# Patient Record
Sex: Female | Born: 2000 | Race: Black or African American | Hispanic: No | Marital: Single | Smoking: Never smoker
Health system: Southern US, Community
[De-identification: ages and names within clinical notes are randomized; demographics above are authoritative.]

## PROBLEM LIST (undated history)

## (undated) ENCOUNTER — Inpatient Hospital Stay (HOSPITAL_COMMUNITY): Payer: Self-pay

## (undated) ENCOUNTER — Inpatient Hospital Stay (HOSPITAL_COMMUNITY): Payer: Medicaid Other

## (undated) DIAGNOSIS — D649 Anemia, unspecified: Secondary | ICD-10-CM

## (undated) DIAGNOSIS — Z8619 Personal history of other infectious and parasitic diseases: Secondary | ICD-10-CM

## (undated) HISTORY — PX: NO PAST SURGERIES: SHX2092

## (undated) HISTORY — PX: INDUCED ABORTION: SHX677

## (undated) HISTORY — DX: Personal history of other infectious and parasitic diseases: Z86.19

---
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
## 2018-01-02 LAB — OB RESULTS CONSOLE RPR: RPR: NONREACTIVE

## 2018-01-02 LAB — OB RESULTS CONSOLE HEPATITIS B SURFACE ANTIGEN: HEP B S AG: NEGATIVE

## 2018-01-02 LAB — OB RESULTS CONSOLE HIV ANTIBODY (ROUTINE TESTING): HIV: NONREACTIVE

## 2018-01-02 LAB — OB RESULTS CONSOLE RUBELLA ANTIBODY, IGM: Rubella: IMMUNE

## 2018-01-16 ENCOUNTER — Inpatient Hospital Stay (HOSPITAL_COMMUNITY)
Admission: AD | Admit: 2018-01-16 | Discharge: 2018-01-16 | Disposition: A | Discharge status: Home / Self Care | Payer: 59 | Source: Ambulatory Visit | Attending: Obstetrics and Gynecology | Admitting: Obstetrics and Gynecology

## 2018-01-16 ENCOUNTER — Encounter (HOSPITAL_COMMUNITY): Payer: Self-pay | Admitting: *Deleted

## 2018-01-16 DIAGNOSIS — R109 Unspecified abdominal pain: Secondary | ICD-10-CM | POA: Insufficient documentation

## 2018-01-16 DIAGNOSIS — O26892 Other specified pregnancy related conditions, second trimester: Secondary | ICD-10-CM | POA: Diagnosis not present

## 2018-01-16 DIAGNOSIS — Z3A14 14 weeks gestation of pregnancy: Secondary | ICD-10-CM | POA: Insufficient documentation

## 2018-01-16 DIAGNOSIS — N898 Other specified noninflammatory disorders of vagina: Secondary | ICD-10-CM | POA: Diagnosis not present

## 2018-01-16 LAB — WET PREP, GENITAL
Clue Cells Wet Prep HPF POC: NONE SEEN
SPERM: NONE SEEN
Trich, Wet Prep: NONE SEEN
WBC WET PREP: NONE SEEN

## 2018-01-16 LAB — URINALYSIS, ROUTINE W REFLEX MICROSCOPIC
BILIRUBIN URINE: NEGATIVE
GLUCOSE, UA: NEGATIVE mg/dL
HGB URINE DIPSTICK: NEGATIVE
Ketones, ur: NEGATIVE mg/dL
Leukocytes, UA: NEGATIVE
Nitrite: NEGATIVE
PH: 6 (ref 5.0–8.0)
Protein, ur: NEGATIVE mg/dL
SPECIFIC GRAVITY, URINE: 1.009 (ref 1.005–1.030)

## 2018-01-16 MED ORDER — TERCONAZOLE 0.4 % VA CREA
1.0000 | TOPICAL_CREAM | Freq: Every day | VAGINAL | 0 refills | Status: DC
Start: 2018-01-16 — End: 2018-03-28

## 2018-01-16 NOTE — MAU Provider Note (Signed)
History     CSN: [id number]  Arrival date and time: 01/16/18 1642   First Provider Initiated Contact with Patient 01/16/18 1842      Chief Complaint  Patient presents with  . Vaginal Bleeding  . Abdominal Pain   HPI   Ms.Christy Moran is a 17 y.o. female G1P0 @ [redacted]w[redacted]d here in MAU with vaginal bleeding. Says the bleeding started today. She was using the bathroom and noticed bleeding when she wiped. The bleeding is bright red in color. Last intercourse was 2 days ago. She noticed the bleeding 1 x today and did not see it again. + lower abdominal cramping. Currently rates her pain 4/10. Did not take anything for the pain.   OB History    Gravida  1   Para      Term      Preterm      AB      Living        SAB      TAB      Ectopic      Multiple      Live Births              History reviewed. No pertinent past medical history.  History reviewed. No pertinent surgical history.  History reviewed. No pertinent family history.  Social History   Tobacco Use  . Smoking status: Never Smoker  . Smokeless tobacco: Never Used  Substance Use Topics  . Alcohol use: No  . Drug use: Never    Allergies: No Known Allergies  Medications Prior to Admission  Medication Sig Dispense Refill Last Dose  . ibuprofen (ADVIL,MOTRIN) 600 MG tablet Take 1 tablet (600 mg total) by mouth every 8 (eight) hours as needed. 30 tablet 0    Results for orders placed or performed during the hospital encounter of 01/16/18 (from the past 48 hour(s))  Urinalysis, Routine w reflex microscopic     Status: Abnormal   Collection Time: 01/16/18  6:32 PM  Result Value Ref Range   Color, Urine STRAW (A) YELLOW   APPearance CLEAR CLEAR   Specific Gravity, Urine 1.009 1.005 - 1.030   pH 6.0 5.0 - 8.0   Glucose, UA NEGATIVE NEGATIVE mg/dL   Hgb urine dipstick NEGATIVE NEGATIVE   Bilirubin Urine NEGATIVE NEGATIVE   Ketones, ur NEGATIVE NEGATIVE mg/dL   Protein, ur NEGATIVE NEGATIVE mg/dL    Nitrite NEGATIVE NEGATIVE   Leukocytes, UA NEGATIVE NEGATIVE    Comment: Performed at [facility], [address], [place], [place] [address]  Wet prep, genital     Status: Abnormal   Collection Time: 01/16/18  6:52 PM  Result Value Ref Range   Yeast Wet Prep HPF POC PRESENT (A) NONE SEEN   Trich, Wet Prep NONE SEEN NONE SEEN   Clue Cells Wet Prep HPF POC NONE SEEN NONE SEEN   WBC, Wet Prep HPF POC NONE SEEN NONE SEEN   Sperm NONE SEEN     Comment: Performed at [facility], [address], [place], [place] [address]   Review of Systems  Constitutional: Negative for fever.  Gastrointestinal: Positive for abdominal pain (Mild cramping 2/10).  Genitourinary: Positive for vaginal bleeding and vaginal discharge.   Physical Exam   Blood pressure 104/64, pulse 99, temperature 98.1 F (36.7 C), temperature source Oral, resp. rate 18, height 5\' 4"  (1.626 m), weight 70 kg, last menstrual period 10/06/2017.  Physical Exam  Constitutional: She is oriented to person, place, and time. She  appears well-developed and well-nourished. No distress.  HENT:  Head: Normocephalic.  Eyes: Pupils are equal, round, and reactive to light.  Genitourinary:  Genitourinary Comments: Vagina - Small amount of thick, white vaginal discharge, no odor  Cervix - No contact bleeding, no active bleeding  Bimanual exam: Cervix closed GC/Chlam, wet prep done Chaperone present for exam.   Neurological: She is alert and oriented to person, place, and time.  Skin: Skin is warm. She is not diaphoretic.  Psychiatric: Her behavior is normal.   MAU Course  Procedures  None  MDM  + fetal heart tones  Wet prep and GC Discussed HPI, labs and results with Dr. Tenny Crawoss ok for DC home O positive blood type   Assessment and Plan   A:  1. Vaginal discharge during pregnancy in second trimester   2. [redacted] weeks gestation of pregnancy     P:  Discharge home in stable condition Return to MAU if symptoms  worsen Rx: Terazol cream  R/U with Dr. Tenny Crawoss, call the office in the future for non emergent complaints.   Venia Carbonasch, Jennifer I, NP 01/16/2018 7:59 PM

## 2018-01-16 NOTE — MAU Note (Signed)
Patient had some red bleeding when she wiped using the bathroom today.  Also having some intermittent abdominal cramping she rates a 2/10.  No bleeding at this time.  Last intercourse 01/14/17.

## 2018-01-16 NOTE — Discharge Instructions (Signed)

## 2018-01-17 LAB — GC/CHLAMYDIA PROBE AMP (~~LOC~~) NOT AT ARMC
Chlamydia: NEGATIVE
Neisseria Gonorrhea: NEGATIVE

## 2018-02-14 ENCOUNTER — Encounter: Payer: Self-pay | Admitting: Family Medicine

## 2018-02-14 ENCOUNTER — Ambulatory Visit (INDEPENDENT_AMBULATORY_CARE_PROVIDER_SITE_OTHER): Payer: 59 | Admitting: Family Medicine

## 2018-02-14 DIAGNOSIS — Z3401 Encounter for supervision of normal first pregnancy, first trimester: Secondary | ICD-10-CM | POA: Diagnosis not present

## 2018-02-14 DIAGNOSIS — Z3402 Encounter for supervision of normal first pregnancy, second trimester: Secondary | ICD-10-CM

## 2018-02-14 NOTE — Progress Notes (Signed)
   PRENATAL VISIT NOTE  Subjective:  Christy Moran is a 17 y.o. G1P0 at [redacted]w[redacted]d being seen today for initial prenatal care. She was being seen at [facility], but had to transfer due to insurance reasons. She is here with FOB, [place].  She is currently monitored for the following issues for this low-risk pregnancy and has Encounter for supervision of normal first pregnancy in first trimester on their problem list.  Patient reports no complaints.  Contractions: Not present. Vag. Bleeding: None.  Movement: Present. Denies leaking of fluid.   The following portions of the patient's history were reviewed and updated as appropriate: allergies, current medications, past family history, past medical history, past social history, past surgical history and problem list. Problem list updated.  Objective:   Vitals:   02/14/18 1047  BP: 132/74  Pulse: 69  Weight: 161 lb (73 kg)    Fetal Status: Fetal Heart Rate (bpm): 145 Fundal Height: 19 cm Movement: Present     General:  Alert, oriented and cooperative. Patient is in no acute distress.  Skin: Skin is warm and dry. No rash noted.   Cardiovascular: Normal heart rate noted  Respiratory: Normal respiratory effort, no problems with respiration noted  Abdomen: Soft, gravid, appropriate for gestational age.  Pain/Pressure: Absent     Pelvic: Cervical exam deferred        Extremities: Normal range of motion.  Edema: None  Mental Status: Normal mood and affect. Normal behavior. Normal judgment and thought content.   Assessment and Plan:  Pregnancy: G1P0 at [redacted]w[redacted]d  1. Encounter for supervision of normal first pregnancy in first trimester FHT and FH normal. Discussed nature of practice, midwifes, fellows, and residents/students. PNL and T21 screening done at [facility] - will obtain records. - [place] MFM OB COMP + 14 WK; Future  Preterm labor symptoms and general obstetric precautions including but not limited to vaginal bleeding, contractions,  leaking of fluid and fetal movement were reviewed in detail with the patient. Please refer to After Visit Summary for other counseling recommendations.  Return in about 4 weeks (around 03/14/2018) for OB f/u.  No future appointments.  Levie Heritage, DO

## 2018-02-17 ENCOUNTER — Encounter (HOSPITAL_COMMUNITY): Payer: Self-pay

## 2018-02-26 ENCOUNTER — Ambulatory Visit (HOSPITAL_COMMUNITY)
Admission: RE | Admit: 2018-02-26 | Discharge: 2018-02-26 | Disposition: A | Discharge status: Home / Self Care | Payer: 59 | Source: Ambulatory Visit | Attending: Family Medicine | Admitting: Family Medicine

## 2018-02-26 DIAGNOSIS — Z363 Encounter for antenatal screening for malformations: Secondary | ICD-10-CM

## 2018-02-26 DIAGNOSIS — Z3A20 20 weeks gestation of pregnancy: Secondary | ICD-10-CM | POA: Diagnosis not present

## 2018-02-26 DIAGNOSIS — Z3402 Encounter for supervision of normal first pregnancy, second trimester: Secondary | ICD-10-CM | POA: Diagnosis not present

## 2018-02-26 DIAGNOSIS — Z3401 Encounter for supervision of normal first pregnancy, first trimester: Secondary | ICD-10-CM

## 2018-03-13 ENCOUNTER — Ambulatory Visit (INDEPENDENT_AMBULATORY_CARE_PROVIDER_SITE_OTHER): Payer: Medicaid Other | Admitting: Family Medicine

## 2018-03-13 VITALS — BP 114/65 | HR 72 | Wt 166.0 lb

## 2018-03-13 DIAGNOSIS — Z3402 Encounter for supervision of normal first pregnancy, second trimester: Secondary | ICD-10-CM

## 2018-03-13 DIAGNOSIS — O26892 Other specified pregnancy related conditions, second trimester: Secondary | ICD-10-CM | POA: Diagnosis not present

## 2018-03-13 DIAGNOSIS — Z23 Encounter for immunization: Secondary | ICD-10-CM | POA: Diagnosis not present

## 2018-03-13 DIAGNOSIS — M9902 Segmental and somatic dysfunction of thoracic region: Secondary | ICD-10-CM

## 2018-03-13 DIAGNOSIS — G44209 Tension-type headache, unspecified, not intractable: Secondary | ICD-10-CM

## 2018-03-13 DIAGNOSIS — Z3401 Encounter for supervision of normal first pregnancy, first trimester: Secondary | ICD-10-CM

## 2018-03-13 NOTE — Progress Notes (Signed)
   PRENATAL VISIT NOTE  Subjective:  Christy Moran is a 17 y.o. G1P0 at [redacted]w[redacted]d being seen today for ongoing prenatal care.  She is currently monitored for the following issues for this low-risk pregnancy and has Encounter for supervision of normal first pregnancy in first trimester on their problem list.  Patient reports tension headache - starts in shoulders and neck, then to back of head. Almost daily. Hasn't tried anything.  Contractions: Not present. Vag. Bleeding: None.  Movement: Present. Denies leaking of fluid.   The following portions of the patient's history were reviewed and updated as appropriate: allergies, current medications, past family history, past medical history, past social history, past surgical history and problem list. Problem list updated.  Objective:   Vitals:   03/13/18 1343  BP: 114/65  Pulse: 72  Weight: 166 lb (75.3 kg)    Fetal Status:   Fundal Height: 22 cm Movement: Present     General:  Alert, oriented and cooperative. Patient is in no acute distress.  Skin: Skin is warm and dry. No rash noted.   Cardiovascular: Normal heart rate noted  Respiratory: Normal respiratory effort, no problems with respiration noted  Abdomen: Soft, gravid, appropriate for gestational age. Pain/Pressure: Absent     Pelvic:  Cervical exam deferred        MSK: Restriction, tenderness, tissue texture changes, and paraspinal spasm in the cervical  spine  Neuro: Moves all four extremities with no focal neurological deficit  Extremities: Normal range of motion.  Edema: None  Mental Status: Normal mood and affect. Normal behavior. Normal judgment and thought content.   OSE: Head FSRL  Cervical C3 FSRR  Thoracic T4 FSRL, T6 FSRR  Rib 4 inhaled on left  Lumbar   Sacrum   Pelvis     Assessment and Plan:  Pregnancy: G1P0 at [redacted]w[redacted]d  1. Encounter for supervision of normal first pregnancy in first trimester FHT normal  2. Tension headache 3. Somatic Dysfunction Thoracic  Spine OMT done after patient permission. HVLA technique utilized. 4 areas treated with improvement of tissue texture and joint mobility. Patient tolerated procedure well.    Preterm labor symptoms and general obstetric precautions including but not limited to vaginal bleeding, contractions, leaking of fluid and fetal movement were reviewed in detail with the patient. Please refer to After Visit Summary for other counseling recommendations.  Return in about 4 weeks (around 04/10/2018) for OB f/u, 2 hr GTT.  Levie Heritage, DO

## 2018-03-28 ENCOUNTER — Ambulatory Visit (HOSPITAL_COMMUNITY)
Admission: EM | Admit: 2018-03-28 | Discharge: 2018-03-28 | Disposition: A | Discharge status: Home / Self Care | Payer: Medicaid Other | Attending: Family Medicine | Admitting: Family Medicine

## 2018-03-28 ENCOUNTER — Encounter (HOSPITAL_COMMUNITY): Payer: Self-pay

## 2018-03-28 DIAGNOSIS — N751 Abscess of Bartholin's gland: Secondary | ICD-10-CM

## 2018-03-28 MED ORDER — CEPHALEXIN 500 MG PO CAPS: 500.0000 mg | ORAL_CAPSULE | Freq: Three times a day (TID) | ORAL | 0 refills | Status: DC

## 2018-03-28 NOTE — ED Triage Notes (Signed)
Pt presents with abscess on vagina that is causing discomfort.  Pt states no active draining or bleeding

## 2018-03-28 NOTE — Discharge Instructions (Signed)
Take the keflex 3 x a day Warm soaks See your GYN if not improving by next week

## 2018-03-28 NOTE — ED Provider Notes (Signed)
[facility]    CSN: [id number] Arrival date & time: 03/28/18  1430     History   Chief Complaint Chief Complaint  Patient presents with  . Abscess    HPI Christy Moran is a 17 y.o. female.   HPI  Patient is 6 months pregnant.  Due February 2.  Pregnancy is going well.  No complications. She noticed a painful swelling on her labia majora 2 days ago.  Tender to touch.  No drainage.  Denies any vaginal discharge, odor.  Denies STD exposure.  No dysuria or frequency.   History reviewed. No pertinent past medical history.  Patient Active Problem List   Diagnosis Date Noted  . Encounter for supervision of normal first pregnancy in first trimester 02/14/2018    History reviewed. No pertinent surgical history.  OB History    Gravida  1   Para      Term      Preterm      AB      Living        SAB      TAB      Ectopic      Multiple      Live Births               Home Medications    Prior to Admission medications   Medication Sig Start Date End Date Taking? Authorizing Provider  cephALEXin (KEFLEX) 500 MG capsule Take 1 capsule (500 mg total) by mouth 3 (three) times daily. 03/28/18   Eustace Moore, MD  Prenatal Vit-Fe Fumarate-FA (MULTIVITAMIN-PRENATAL) 27-0.8 MG TABS tablet Take 1 tablet by mouth daily at 12 noon.    [provider]    Family History Family History  Problem Relation Age of Onset  . Hypertension Mother   . Hypertension Father   . Stroke Maternal Uncle   . Cancer Maternal Grandfather        prostate  . Cancer Paternal Grandfather   . Drug abuse Neg Hx     Social History Social History   Tobacco Use  . Smoking status: Never Smoker  . Smokeless tobacco: Never Used  Substance Use Topics  . Alcohol use: No  . Drug use: Never     Allergies   Patient has no known allergies.   Review of Systems Review of Systems  Constitutional: Negative for chills and fever.  HENT: Negative for ear pain  and sore throat.   Eyes: Negative for pain and visual disturbance.  Respiratory: Negative for cough and shortness of breath.   Cardiovascular: Negative for chest pain and palpitations.  Gastrointestinal: Negative for abdominal pain and vomiting.  Genitourinary: Positive for vaginal pain. Negative for dysuria and hematuria.  Musculoskeletal: Negative for arthralgias and back pain.  Skin: Negative for color change and rash.  Neurological: Negative for seizures and syncope.  All other systems reviewed and are negative.    Physical Exam Triage Vital Signs ED Triage Vitals  Enc Vitals Group     BP 03/28/18 1505 (!) 114/59     Pulse Rate 03/28/18 1505 100     Resp 03/28/18 1505 20     Temp 03/28/18 1505 98 F (36.7 C)     Temp Source 03/28/18 1505 Oral     SpO2 03/28/18 1505 100 %     Weight --      Height --      Head Circumference --      Peak Flow --  Pain Score 03/28/18 1507 5     Pain Loc --      Pain Edu? --      Excl. in GC? --    No data found.  Updated Vital Signs BP (!) 114/59 (BP Location: Right Arm)   Pulse 100   Temp 98 F (36.7 C) (Oral)   Resp 20   LMP 10/06/2017 (Exact Date)   SpO2 100%       Physical Exam  Constitutional: She appears well-developed and well-nourished. No distress.  HENT:  Head: Normocephalic and atraumatic.  Mouth/Throat: Oropharynx is clear and moist.  Eyes: Pupils are equal, round, and reactive to light. Conjunctivae are normal.  Neck: Normal range of motion.  Cardiovascular: Normal rate.  Pulmonary/Chest: Effort normal. No respiratory distress.  Abdominal: Soft. She exhibits no distension.  Genitourinary:  Genitourinary Comments: 15 mm nodule, moderately tender, palpable on the left labia inferiorly.  Odor of BV  Musculoskeletal: Normal range of motion. She exhibits no edema.  Neurological: She is alert.  Skin: Skin is warm and dry.     UC Treatments / Results  Labs (all labs ordered are listed, but only abnormal  results are displayed) Labs Reviewed - No data to display  EKG None  Radiology No results found.  Procedures Procedures (including critical care time)  Medications Ordered in UC Medications - No data to display  Initial Impression / Assessment and Plan / UC Course  I have reviewed the triage vital signs and the nursing notes.  Pertinent labs & imaging results that were available during my care of the patient were reviewed by me and considered in my medical decision making (see chart for details).      Final Clinical Impressions(s) / UC Diagnoses   Final diagnoses:  Abscess of Bartholin's gland     Discharge Instructions     Take the keflex 3 x a day Warm soaks See your GYN if not improving by next week   ED Prescriptions    Medication Sig Dispense Auth. Provider   cephALEXin (KEFLEX) 500 MG capsule Take 1 capsule (500 mg total) by mouth 3 (three) times daily. 21 capsule Eustace Moore, MD     Controlled Substance Prescriptions [place] Controlled Substance Registry consulted? Not Applicable   Eustace Moore, MD 03/28/18 [phone]

## 2018-04-03 ENCOUNTER — Ambulatory Visit (HOSPITAL_COMMUNITY)
Admission: EM | Admit: 2018-04-03 | Discharge: 2018-04-03 | Disposition: A | Discharge status: Home / Self Care | Payer: Medicaid Other | Attending: Family Medicine | Admitting: Family Medicine

## 2018-04-03 ENCOUNTER — Encounter (HOSPITAL_COMMUNITY): Payer: Self-pay | Admitting: Emergency Medicine

## 2018-04-03 DIAGNOSIS — N750 Cyst of Bartholin's gland: Secondary | ICD-10-CM

## 2018-04-03 NOTE — Discharge Instructions (Addendum)
It was nice meeting you!!  We drained the cyst Keep doing the warm soaks and keep covered.  Finish your antibiotics.  Follow up as needed for continued or worsening symptoms

## 2018-04-03 NOTE — ED Triage Notes (Signed)
Pt c/o dx with bartholin's cyst and states its worse and painful to sit down.

## 2018-04-04 DIAGNOSIS — N750 Cyst of Bartholin's gland: Secondary | ICD-10-CM | POA: Diagnosis not present

## 2018-04-04 NOTE — ED Provider Notes (Addendum)
[facility]    CSN: [id number] Arrival date & time: 04/03/18  1658     History   Chief Complaint Chief Complaint  Patient presents with  . Cyst    HPI Christy Moran is a 17 y.o. female.   Pt is a 17 year old female that presents with abscess to the vaginal area. She was seen here 03/28/18 and dx with bartholin cyst. She was instructed to do warm compresses and start keflex which she has been doing. She reports that the area has become more painful ans symptoms are constant. . She is here today for possible I&D. She denies any fever, chills, body aches. She denies any drainage from the area.   ROS per HPI      History reviewed. No pertinent past medical history.  Patient Active Problem List   Diagnosis Date Noted  . Encounter for supervision of normal first pregnancy in first trimester 02/14/2018    History reviewed. No pertinent surgical history.  OB History    Gravida  1   Para      Term      Preterm      AB      Living        SAB      TAB      Ectopic      Multiple      Live Births               Home Medications    Prior to Admission medications   Medication Sig Start Date End Date Taking? Authorizing Provider  cephALEXin (KEFLEX) 500 MG capsule Take 1 capsule (500 mg total) by mouth 3 (three) times daily. 03/28/18   Eustace Moore, MD  Prenatal Vit-Fe Fumarate-FA (MULTIVITAMIN-PRENATAL) 27-0.8 MG TABS tablet Take 1 tablet by mouth daily at 12 noon.    [provider]    Family History Family History  Problem Relation Age of Onset  . Hypertension Mother   . Hypertension Father   . Stroke Maternal Uncle   . Cancer Maternal Grandfather        prostate  . Cancer Paternal Grandfather   . Drug abuse Neg Hx     Social History Social History   Tobacco Use  . Smoking status: Never Smoker  . Smokeless tobacco: Never Used  Substance Use Topics  . Alcohol use: No  . Drug use: Never     Allergies     Patient has no known allergies.   Review of Systems Review of Systems   Physical Exam Triage Vital Signs ED Triage Vitals  Enc Vitals Group     BP 04/03/18 1707 130/62     Pulse Rate 04/03/18 1707 75     Resp 04/03/18 1707 18     Temp 04/03/18 1707 97.9 F (36.6 C)     Temp src --      SpO2 04/03/18 1707 100 %     Weight --      Height --      Head Circumference --      Peak Flow --      Pain Score 04/03/18 1708 9     Pain Loc --      Pain Edu? --      Excl. in GC? --    No data found.  Updated Vital Signs BP 130/62   Pulse 75   Temp 97.9 F (36.6 C)   Resp 18   LMP 10/06/2017 (Exact Date)  SpO2 100%   Visual Acuity Right Eye Distance:   Left Eye Distance:   Bilateral Distance:    Right Eye Near:   Left Eye Near:    Bilateral Near:     Physical Exam  Constitutional: She appears well-developed and well-nourished.  Very pleasant. Non toxic or ill appearing.   HENT:  Head: Normocephalic.  Eyes: Conjunctivae are normal.  Neck: Normal range of motion.  Pulmonary/Chest: Effort normal.  Genitourinary:  Genitourinary Comments: White vaginal discharge noted. 1.5 cm nodule to left  Labia inferior. Erythematous and moderately tender to touch. Fluctuant. No induration. No drainage.   Musculoskeletal: Normal range of motion.  Neurological: She is alert.  Skin: Skin is warm.  Psychiatric: She has a normal mood and affect.  Nursing note and vitals reviewed.    UC Treatments / Results  Labs (all labs ordered are listed, but only abnormal results are displayed) Labs Reviewed - No data to display  EKG None  Radiology No results found.  Procedures Incision and Drainage Date/Time: 04/04/2018 11:37 AM Performed by: Janace Aris, NP Authorized by: Janace Aris, NP   Consent:    Consent obtained:  Verbal   Consent given by:  Patient   Risks discussed:  Bleeding and incomplete drainage   Alternatives discussed:  No treatment Location:    Type:   Bartholin cyst   Size:  1.5 cm   Location:  Anogenital   Anogenital location:  Bartholin's gland Pre-procedure details:    Skin preparation:  Betadine Anesthesia (see MAR for exact dosages):    Anesthesia method:  Local infiltration   Local anesthetic:  Lidocaine 2% w/o epi Procedure type:    Complexity:  Simple Procedure details:    Needle aspiration: no     Incision types:  Single straight   Incision depth:  Subcutaneous   Scalpel blade:  15   Wound management:  Probed and deloculated   Drainage:  Bloody and purulent   Drainage amount:  Moderate   Wound treatment:  Wound left open   Packing materials:  None Post-procedure details:    Patient tolerance of procedure:  Tolerated well, no immediate complications   (including critical care time)  Medications Ordered in UC Medications - No data to display  Initial Impression / Assessment and Plan / UC Course  I have reviewed the triage vital signs and the nursing notes.  Pertinent labs & imaging results that were available during my care of the patient were reviewed by me and considered in my medical decision making (see chart for details).     I&D of bartholin cyst. Moderate purulent drainage. Pt tolerated well and instructed to  wear pad.  Warm compresses.  Finish the keflex.  Follow up as needed for continued or worsening symptoms   Final Clinical Impressions(s) / UC Diagnoses   Final diagnoses:  Bartholin cyst     Discharge Instructions     It was nice meeting you!!  We drained the cyst Keep doing the warm soaks and keep covered.  Finish your antibiotics.  Follow up as needed for continued or worsening symptoms     ED Prescriptions    None     Controlled Substance Prescriptions [place] Controlled Substance Registry consulted? no   Janace Aris, NP 04/04/18 1134    Dahlia Byes A, NP 04/04/18 1138

## 2018-04-10 ENCOUNTER — Encounter: Payer: Medicaid Other | Admitting: Family Medicine

## 2018-05-02 ENCOUNTER — Other Ambulatory Visit: Payer: Self-pay

## 2018-05-02 ENCOUNTER — Ambulatory Visit
Admission: EM | Admit: 2018-05-02 | Discharge: 2018-05-02 | Disposition: A | Discharge status: Home / Self Care | Payer: Medicaid Other | Attending: Family Medicine | Admitting: Family Medicine

## 2018-05-02 ENCOUNTER — Encounter: Payer: Self-pay | Admitting: Emergency Medicine

## 2018-05-02 DIAGNOSIS — N751 Abscess of Bartholin's gland: Secondary | ICD-10-CM | POA: Diagnosis not present

## 2018-05-02 MED ORDER — CEPHALEXIN 500 MG PO CAPS: 500.0000 mg | ORAL_CAPSULE | Freq: Three times a day (TID) | ORAL | 0 refills | Status: AC

## 2018-05-02 NOTE — ED Triage Notes (Signed)
Pt c/o bartholin's cyst. She had this back in October and was treated with antibiotic. She reports that the abscess is back. Started back about 5 days ago. She saw her OB and they told her to use warm compresses but the area is getting worse and more painful. She is [redacted] weeks pregnant.

## 2018-05-02 NOTE — Discharge Instructions (Addendum)
Take medication as prescribed. Rest. Drink plenty of fluids. Frequent warm compresses. Keep clean. Monitor.   Follow up with your OBGYN this Monday as discussed.   Follow up with your primary care physician this week as needed. Return to Urgent care for new or worsening concerns.

## 2018-05-02 NOTE — ED Provider Notes (Addendum)
[facility] ____________________________________________  Time seen: Approximately 3:28 PM  I have reviewed the triage vital signs and the nursing notes.   HISTORY  Chief Complaint Abscess   HPI Christy Moran is a 17 y.o. female is [redacted] weeks pregnant, presenting for evaluation of right vaginal tenderness and swelling present for the last 4 to 5 days, gradual in onset.  Reports diagnosed at the end of October with a Bartholin's abscess and was treated with antibiotics as well as incision and drainage.  Patient reports she continued to have a very small knot to the same area that did not ever fully resolve, but reports the swelling went down and pain resolved.  Was seen by her OB/GYN earlier this week for vaginal discharge, in which she was diagnosed for bacterial vaginitis and is currently taking Flagyl.  States she is tolerating this well.  Was not started on any antibiotic for the Bartholin's abscess at that time, and reports the area has continued to increase.  Has applied occasional warm compresses via wash cloth.  Denies any other alleviating measures attempted.  States the area is painful, currently moderately.  Denies any accompanying abdominal pain, vaginal bleeding, dysuria, fevers, change in vaginal discharge.  Denies chest pain, shortness of breath or fevers.  Reports otherwise doing well.  OBGYN: in [place]  History reviewed. No pertinent past medical history.  Patient Active Problem List   Diagnosis Date Noted  . Encounter for supervision of normal first pregnancy in first trimester 02/14/2018    Past Surgical History:  Procedure Laterality Date  . NO PAST SURGERIES       No current facility-administered medications for this encounter.   Current Outpatient Medications:  .  ferrous sulfate 325 (65 FE) MG tablet, Take 325 mg by mouth daily with breakfast., Disp: , Rfl:  .  metroNIDAZOLE (FLAGYL) 500 MG tablet, TK 1 T PO  Q 12 H, Disp: , Rfl: 0 .   Prenatal Vit-Fe Fumarate-FA (MULTIVITAMIN-PRENATAL) 27-0.8 MG TABS tablet, Take 1 tablet by mouth daily at 12 noon., Disp: , Rfl:  .  cephALEXin (KEFLEX) 500 MG capsule, Take 1 capsule (500 mg total) by mouth 3 (three) times daily for 10 days., Disp: 30 capsule, Rfl: 0  Allergies Patient has no known allergies.  Family History  Problem Relation Age of Onset  . Hypertension Mother   . Hypertension Father   . Stroke Maternal Uncle   . Cancer Maternal Grandfather        prostate  . Cancer Paternal Grandfather   . Drug abuse Neg Hx     Social History Social History   Tobacco Use  . Smoking status: Never Smoker  . Smokeless tobacco: Never Used  Substance Use Topics  . Alcohol use: No  . Drug use: Never    Review of Systems Constitutional: No fevers. Cardiovascular: Denies chest pain. Respiratory: Denies shortness of breath. Gastrointestinal: No abdominal pain.  Musculoskeletal: Negative for back pain. Skin:as above.  ____________________________________________   PHYSICAL EXAM:  VITAL SIGNS: ED Triage Vitals  Enc Vitals Group     BP 05/02/18 1450 122/73     Pulse Rate 05/02/18 1450 (!) 102     Resp 05/02/18 1450 16     Temp 05/02/18 1450 97.9 F (36.6 C)     Temp Source 05/02/18 1450 Oral     SpO2 05/02/18 1450 100 %     Weight 05/02/18 1446 182 lb (82.6 kg)     Height 05/02/18 1446 5\' 4"  (1.626  m)     Head Circumference --      Peak Flow --      Pain Score 05/02/18 1446 8     Pain Loc --      Pain Edu? --      Excl. in GC? --     Fetal heart tones 142bmp per CMA Melissa.   Constitutional: Alert and oriented. Well appearing and in no acute distress. ENT      Head: Normocephalic and atraumatic. Cardiovascular: Normal rate, regular rhythm. Grossly normal heart sounds.  Good peripheral circulation. Respiratory: Normal respiratory effort without tachypnea nor retractions. Breath sounds are clear and equal bilaterally. No wheezes, rales,  rhonchi. Gastrointestinal: Soft and nontender. Gavid abdomen. Pelvic: External only exam performed.  Melissa CMA at bedside as chaperone. Right lower labia along bartholin gland area of approximately 1 cm swelling, moderate tenderness, no drainage, nonpointing, mild erythema, no surrounding erythema, no drainage.  Mild active whitish external vaginal discharge noted. Musculoskeletal: No midline cervical, thoracic or lumbar tenderness to palpation.  Neurologic:  Normal speech and language.  Speech is normal. No gait instability.  Skin:  Skin is warm, dry.  Psychiatric: Mood and affect are normal. Speech and behavior are normal. Patient exhibits appropriate insight and judgment   ___________________________________________   LABS (all labs ordered are listed, but only abnormal results are displayed)  Labs Reviewed - No data to display   PROCEDURES Procedures    INITIAL IMPRESSION / ASSESSMENT AND PLAN / ED COURSE  Pertinent labs & imaging results that were available during my care of the patient were reviewed by me and considered in my medical decision making (see chart for details).  Well-appearing patient.  No acute distress. Denies pregnancy complaints.  Right bartholin's abscess present.  Will begin antibiotic therapy with oral Keflex.  Counseled to use frequent warm compresses and keep clean.  Follow-up with OB/GYN this coming Monday.  Discussed sooner return parameters.Discussed indication, risks and benefits of medications with patient.  Discussed follow up with OBGYN this week. Discussed follow up and return parameters including no resolution or any worsening concerns. Patient verbalized understanding and agreed to plan.   ____________________________________________   FINAL CLINICAL IMPRESSION(S) / ED DIAGNOSES  Final diagnoses:  Bartholin's gland abscess     ED Discharge Orders         Ordered    cephALEXin (KEFLEX) 500 MG capsule  3 times daily     05/02/18 1531            Note: This dictation was prepared with Dragon dictation along with smaller phrase technology. Any transcriptional errors that result from this process are unintentional.         Renford DillsMiller, Allenmichael Mcpartlin, NP 05/02/18 [phone]

## 2018-05-08 ENCOUNTER — Emergency Department (HOSPITAL_BASED_OUTPATIENT_CLINIC_OR_DEPARTMENT_OTHER)
Admission: EM | Admit: 2018-05-08 | Discharge: 2018-05-09 | Disposition: A | Discharge status: Home / Self Care | Payer: Medicaid Other | Attending: Emergency Medicine | Admitting: Emergency Medicine

## 2018-05-08 ENCOUNTER — Other Ambulatory Visit: Payer: Self-pay

## 2018-05-08 ENCOUNTER — Encounter (HOSPITAL_BASED_OUTPATIENT_CLINIC_OR_DEPARTMENT_OTHER): Payer: Self-pay | Admitting: *Deleted

## 2018-05-08 DIAGNOSIS — O219 Vomiting of pregnancy, unspecified: Secondary | ICD-10-CM | POA: Insufficient documentation

## 2018-05-08 DIAGNOSIS — Z79899 Other long term (current) drug therapy: Secondary | ICD-10-CM | POA: Diagnosis not present

## 2018-05-08 DIAGNOSIS — Z3A30 30 weeks gestation of pregnancy: Secondary | ICD-10-CM | POA: Insufficient documentation

## 2018-05-08 DIAGNOSIS — R112 Nausea with vomiting, unspecified: Secondary | ICD-10-CM

## 2018-05-08 NOTE — ED Provider Notes (Addendum)
[facility] EMERGENCY DEPARTMENT Provider Note   CSN: [id number] Arrival date & time: 05/08/18  2158     History   Chief Complaint Chief Complaint  Patient presents with  . Emesis    HPI Christy Moran is a 17 y.o. female.  HPI   17 yo G1P0 at [redacted] wk GA here w/ nausea, mild fatigue throughout the day. Pt states that recently, she has been on keflex for a bartholin's cyst and was also given Rocephin/Azithro at her OB 2 days ago. Afte rreceiving the meds, she has had mild nausea and began vomiting today. She has been able to hold food down, however. She states she became worried 2/2 her recent meds and presents for eval. No fevers. No abdominal pain. No VB, LOF. She is feeling baby move. No Ctx. No specific alleviating or aggravating factors. She has not tried anything ofr nausea.  History reviewed. No pertinent past medical history.  Patient Active Problem List   Diagnosis Date Noted  . Encounter for supervision of normal first pregnancy in first trimester 02/14/2018    Past Surgical History:  Procedure Laterality Date  . NO PAST SURGERIES       OB History    Gravida  1   Para      Term      Preterm      AB      Living        SAB      TAB      Ectopic      Multiple      Live Births               Home Medications    Prior to Admission medications   Medication Sig Start Date End Date Taking? Authorizing Provider  cephALEXin (KEFLEX) 500 MG capsule Take 1 capsule (500 mg total) by mouth 3 (three) times daily for 10 days. 05/02/18 05/12/18  Renford Dills, NP  Doxylamine-Pyridoxine (DICLEGIS) 10-10 MG TBEC Take 1 tablet by mouth every 8 (eight) hours as needed (nausea, vomiting). 05/09/18   Shaune Pollack, MD  ferrous sulfate 325 (65 FE) MG tablet Take 325 mg by mouth daily with breakfast.    [provider]  metroNIDAZOLE (FLAGYL) 500 MG tablet TK 1 T PO  Q 12 H 04/30/18   [provider]  Prenatal Vit-Fe Fumarate-FA  (MULTIVITAMIN-PRENATAL) 27-0.8 MG TABS tablet Take 1 tablet by mouth daily at 12 noon.    [provider]    Family History Family History  Problem Relation Age of Onset  . Hypertension Mother   . Hypertension Father   . Stroke Maternal Uncle   . Cancer Maternal Grandfather        prostate  . Cancer Paternal Grandfather   . Drug abuse Neg Hx     Social History Social History   Tobacco Use  . Smoking status: Never Smoker  . Smokeless tobacco: Never Used  Substance Use Topics  . Alcohol use: No  . Drug use: Never     Allergies   Patient has no known allergies.   Review of Systems Review of Systems  Constitutional: Positive for fatigue. Negative for chills and fever.  HENT: Negative for congestion, rhinorrhea and sore throat.   Eyes: Negative for visual disturbance.  Respiratory: Negative for cough, shortness of breath and wheezing.   Cardiovascular: Negative for chest pain and leg swelling.  Gastrointestinal: Positive for nausea and vomiting. Negative for abdominal pain and diarrhea.  Genitourinary:  Negative for dysuria, flank pain, vaginal bleeding and vaginal discharge.  Musculoskeletal: Negative for neck pain.  Skin: Negative for rash.  Allergic/Immunologic: Negative for immunocompromised state.  Neurological: Negative for syncope and headaches.  Hematological: Does not bruise/bleed easily.  All other systems reviewed and are negative.    Physical Exam Updated Vital Signs BP 114/73   Pulse 80   Temp 98.7 F (37.1 C)   Resp 18   Ht 5\' 4"  (1.626 m)   Wt 82.5 kg   LMP 10/06/2017 (Exact Date)   SpO2 100%   BMI 31.22 kg/m   Physical Exam  Constitutional: She is oriented to person, place, and time. She appears well-developed and well-nourished. No distress.  HENT:  Head: Normocephalic and atraumatic.  Eyes: Conjunctivae are normal.  Neck: Neck supple.  Cardiovascular: Normal rate, regular rhythm and normal heart sounds. Exam reveals no friction  rub.  No murmur heard. Pulmonary/Chest: Effort normal and breath sounds normal. No respiratory distress. She has no wheezes. She has no rales.  Abdominal: Soft. Bowel sounds are normal. She exhibits distension. There is no tenderness. There is no rebound and no guarding.  Gravid, non-tender  Musculoskeletal: She exhibits no edema.  Neurological: She is alert and oriented to person, place, and time. She exhibits normal muscle tone.  Skin: Skin is warm. Capillary refill takes less than 2 seconds. No rash noted.  Psychiatric: She has a normal mood and affect.  Nursing note and vitals reviewed.    ED Treatments / Results  Labs (all labs ordered are listed, but only abnormal results are displayed) Labs Reviewed - No data to display  EKG None  Radiology No results found.  Procedures Procedures (including critical care time)  Medications Ordered in ED Medications  diphenhydrAMINE (BENADRYL) capsule 25 mg (25 mg Oral Given 05/09/18 0038)  metoCLOPramide (REGLAN) tablet 10 mg (10 mg Oral Given 05/09/18 0038)     Initial Impression / Assessment and Plan / ED Course  I have reviewed the triage vital signs and the nursing notes.  Pertinent labs & imaging results that were available during my care of the patient were reviewed by me and considered in my medical decision making (see chart for details).     17 yo G1 at [redacted] wk GA here w/ mild nausea, vomiting now controlled. Suspect this is likely 2/2 her recent ABX use. No fevers, no focal TTP to suggest appendicitis, cholecystitis, or other intra-abd pathology. Her VS are stable and normal. Toco/FHR is normal and reassuring, with clearance by OB. Given clearance from OB, pt declines any further labs and states she just wanted to make sure baby was okay. Will give a short course of diclegis and advise to eat yogurt for probiotics. Return precautions given. She is being monitored by her OB for her Bartholin's which is now resolved.  Final  Clinical Impressions(s) / ED Diagnoses   Final diagnoses:  Non-intractable vomiting with nausea, unspecified vomiting type    ED Discharge Orders         Ordered    Doxylamine-Pyridoxine (DICLEGIS) 10-10 MG TBEC  Every 8 hours PRN     05/09/18 0047           Shaune PollackIsaacs, Graylin Sperling, MD 05/09/18 [id number]    Shaune PollackIsaacs, Aura Bibby, MD 05/09/18 [phone]

## 2018-05-08 NOTE — ED Triage Notes (Addendum)
Vomiting today. Slight headache. [facility] Bartholin [facility]. The cyst is now gone. She is [redacted] weeks pregnant. Baby is moving. No vaginal leakage. She is taking antibiotics for an STD 2 days ago.

## 2018-05-09 MED ORDER — DOXYLAMINE-PYRIDOXINE 10-10 MG PO TBEC: 1.0000 | DELAYED_RELEASE_TABLET | Freq: Three times a day (TID) | ORAL | 0 refills | Status: DC | PRN

## 2018-05-09 MED ORDER — DIPHENHYDRAMINE HCL 25 MG PO CAPS
25.0000 mg | ORAL_CAPSULE | Freq: Once | ORAL | Status: AC
  Administered 2018-05-09: 25 mg via ORAL
  Filled 2018-05-09: qty 1

## 2018-05-09 MED ORDER — LACTATED RINGERS IV BOLUS: 1000.0000 mL | Freq: Once | INTRAVENOUS | Status: DC

## 2018-05-09 MED ORDER — METOCLOPRAMIDE HCL 5 MG/ML IJ SOLN: 10.0000 mg | Freq: Once | INTRAMUSCULAR | Status: DC

## 2018-05-09 MED ORDER — DIPHENHYDRAMINE HCL 50 MG/ML IJ SOLN: 25.0000 mg | Freq: Once | INTRAMUSCULAR | Status: DC

## 2018-05-09 MED ORDER — METOCLOPRAMIDE HCL 10 MG PO TABS
10.0000 mg | ORAL_TABLET | Freq: Once | ORAL | Status: AC
  Administered 2018-05-09: 10 mg via ORAL
  Filled 2018-05-09: qty 1

## 2018-05-09 NOTE — ED Notes (Signed)
ED Provider at bedside. 

## 2018-05-09 NOTE — Discharge Instructions (Addendum)
I suspect your symptoms are due to your antibiotic use.  I'd recommend live-culture yogurt twice a day for 6-7 days.  Take the antinausea med as prescribed.  Drink plenty of fluids.

## 2018-05-20 ENCOUNTER — Other Ambulatory Visit: Payer: Self-pay

## 2018-05-20 ENCOUNTER — Inpatient Hospital Stay (HOSPITAL_COMMUNITY): Payer: Medicaid Other

## 2018-05-20 ENCOUNTER — Inpatient Hospital Stay (HOSPITAL_COMMUNITY)
Admission: AD | Admit: 2018-05-20 | Discharge: 2018-05-20 | Disposition: A | Discharge status: Home / Self Care | Payer: Medicaid Other | Attending: Obstetrics and Gynecology | Admitting: Obstetrics and Gynecology

## 2018-05-20 ENCOUNTER — Encounter (HOSPITAL_COMMUNITY): Payer: Self-pay | Admitting: *Deleted

## 2018-05-20 DIAGNOSIS — R0602 Shortness of breath: Secondary | ICD-10-CM | POA: Diagnosis not present

## 2018-05-20 DIAGNOSIS — Z3A32 32 weeks gestation of pregnancy: Secondary | ICD-10-CM

## 2018-05-20 DIAGNOSIS — Z3689 Encounter for other specified antenatal screening: Secondary | ICD-10-CM

## 2018-05-20 DIAGNOSIS — O26893 Other specified pregnancy related conditions, third trimester: Secondary | ICD-10-CM | POA: Diagnosis not present

## 2018-05-20 DIAGNOSIS — R0789 Other chest pain: Secondary | ICD-10-CM | POA: Diagnosis not present

## 2018-05-20 LAB — COMPREHENSIVE METABOLIC PANEL
ALT: 21 U/L (ref 0–44)
AST: 26 U/L (ref 15–41)
Albumin: 2.6 g/dL — ABNORMAL LOW (ref 3.5–5.0)
Alkaline Phosphatase: 74 U/L (ref 38–126)
Anion gap: 9 (ref 5–15)
BUN: 6 mg/dL (ref 6–20)
CO2: 21 mmol/L — ABNORMAL LOW (ref 22–32)
Calcium: 8.4 mg/dL — ABNORMAL LOW (ref 8.9–10.3)
Chloride: 103 mmol/L (ref 98–111)
Creatinine, Ser: 0.77 mg/dL (ref 0.44–1.00)
GFR calc Af Amer: >60 mL/min (ref >60)
Glucose, Bld: 111 mg/dL — ABNORMAL HIGH (ref 70–99)
Potassium: 3.4 mmol/L — ABNORMAL LOW (ref 3.5–5.1)
Sodium: 133 mmol/L — ABNORMAL LOW (ref 135–145)
Total Bilirubin: 0.2 mg/dL — ABNORMAL LOW (ref 0.3–1.2)
Total Protein: 6.4 g/dL — ABNORMAL LOW (ref 6.5–8.1)

## 2018-05-20 LAB — CBC WITH DIFFERENTIAL/PLATELET
Basophils Absolute: 0.1 10*3/uL (ref 0.0–0.1)
Basophils Relative: 2 %
Eosinophils Absolute: 0 10*3/uL (ref 0.0–0.5)
Eosinophils Relative: 0 %
HCT: 31.1 % — ABNORMAL LOW (ref 36.0–46.0)
Hemoglobin: 10 g/dL — ABNORMAL LOW (ref 12.0–15.0)
Lymphocytes Relative: 53 %
Lymphs Abs: 3.3 10*3/uL (ref 0.7–4.0)
MCH: 28.2 pg (ref 26.0–34.0)
MCHC: 32.2 g/dL (ref 30.0–36.0)
MCV: 87.9 fL (ref 80.0–100.0)
Monocytes Absolute: 0.2 10*3/uL (ref 0.1–1.0)
Monocytes Relative: 3 %
Neutro Abs: 2.6 10*3/uL (ref 1.7–7.7)
Neutrophils Relative %: 42 %
PLATELETS: 136 10*3/uL — AB (ref 150–400)
RBC: 3.54 MIL/uL — ABNORMAL LOW (ref 3.87–5.11)
RDW: 13.7 % (ref 11.5–15.5)
WBC: 6.2 10*3/uL (ref 4.0–10.5)
nRBC: 0 % (ref 0.0–0.2)

## 2018-05-20 LAB — URINALYSIS, ROUTINE W REFLEX MICROSCOPIC
Bilirubin Urine: NEGATIVE
Glucose, UA: NEGATIVE mg/dL
Hgb urine dipstick: NEGATIVE
Ketones, ur: NEGATIVE mg/dL
LEUKOCYTES UA: NEGATIVE
Nitrite: NEGATIVE
Protein, ur: NEGATIVE mg/dL
Specific Gravity, Urine: 1.009 (ref 1.005–1.030)
pH: 7 (ref 5.0–8.0)

## 2018-05-20 NOTE — MAU Provider Note (Signed)
History     CSN: [id number]  Arrival date and time: 05/20/18 1403   First Provider Initiated Contact with Patient 05/20/18 1421      Chief Complaint  Patient presents with  . Shortness of Breath  . Chest Pain   HPI  Christy Moran is a 17 y.o. G1P0 at [redacted]w[redacted]d sent from clinic for evaluation of intermittent SOB for the past two weeks. She states in clinic this morning her Provider listened to her lungs and "heard something going on on the left side so she told me I'd get an Xray". Patient denies dizziness, vaginal bleeding, leaking of fluid, decreased fetal movement, fever, falls, or recent illness.  Non-smoker.   OB History    Gravida  1   Para      Term      Preterm      AB      Living        SAB      TAB      Ectopic      Multiple      Live Births              History reviewed. No pertinent past medical history.  Past Surgical History:  Procedure Laterality Date  . NO PAST SURGERIES      Family History  Problem Relation Age of Onset  . Hypertension Mother   . Hypertension Father   . Stroke Maternal Uncle   . Cancer Maternal Grandfather        prostate  . Cancer Paternal Grandfather   . Drug abuse Neg Hx     Social History   Tobacco Use  . Smoking status: Never Smoker  . Smokeless tobacco: Never Used  Substance Use Topics  . Alcohol use: No  . Drug use: Never    Allergies: No Known Allergies  Medications Prior to Admission  Medication Sig Dispense Refill Last Dose  . Doxylamine-Pyridoxine (DICLEGIS) 10-10 MG TBEC Take 1 tablet by mouth every 8 (eight) hours as needed (nausea, vomiting). 15 tablet 0   . ferrous sulfate 325 (65 FE) MG tablet Take 325 mg by mouth daily with breakfast.     . metroNIDAZOLE (FLAGYL) 500 MG tablet TK 1 T PO  Q 12 H  0   . Prenatal Vit-Fe Fumarate-FA (MULTIVITAMIN-PRENATAL) 27-0.8 MG TABS tablet Take 1 tablet by mouth daily at 12 noon.   Taking    Review of Systems  Constitutional: Negative for chills  and fever.  Respiratory: Positive for shortness of breath. Negative for cough and chest tightness.   Cardiovascular: Negative for chest pain.  Gastrointestinal: Negative for abdominal pain, diarrhea, nausea and vomiting.  Genitourinary: Negative for vaginal bleeding, vaginal discharge and vaginal pain.  Musculoskeletal: Negative for back pain.  Neurological: Negative for dizziness and headaches.  All other systems reviewed and are negative.  Physical Exam   Blood pressure 128/64, pulse 98, temperature 97.8 F (36.6 C), resp. rate 16, last menstrual period 10/06/2017, SpO2 100 %.  Physical Exam  Nursing note and vitals reviewed. Constitutional: She is oriented to person, place, and time. She appears well-developed and well-nourished.  Cardiovascular: Normal rate, normal heart sounds and intact distal pulses.  Respiratory: Effort normal and breath sounds normal. No accessory muscle usage. No tachypnea. No respiratory distress. She has no decreased breath sounds. She has no wheezes. She has no rhonchi. She has no rales. She exhibits no tenderness.  GI: Soft. She exhibits no distension. There is no tenderness. There  is no rebound and no guarding.  Genitourinary:  Genitourinary Comments: Not evaluated based on chief complaint  Musculoskeletal: Normal range of motion.  Neurological: She is alert and oriented to person, place, and time.  Skin: Skin is warm and dry.  Psychiatric: She has a normal mood and affect. Her behavior is normal. Judgment and thought content normal.   Lungs clear to auscultation in all fields. No signs of air hunger. Patient is speaking and eating without difficulty in complete sentences without stopping.  MAU Course/MDM    --Normal EKG --Reactive fetal tracing: baseline 130, moderate variability, positive accels, no decels --Toco: quiet  Patient Vitals for the past 24 hrs:  BP Temp Pulse Resp SpO2  05/20/18 1602 123/68 - 95 18 100 %  05/20/18 1418 128/64 97.8  F (36.6 C) 98 16 100 %     Results for orders placed or performed during the hospital encounter of 05/20/18 (from the past 24 hour(s))  CBC with Differential/Platelet     Status: Abnormal   Collection Time: 05/20/18  2:45 PM  Result Value Ref Range   WBC 6.2 4.0 - 10.5 K/uL   RBC 3.54 (L) 3.87 - 5.11 MIL/uL   Hemoglobin 10.0 (L) 12.0 - 15.0 g/dL   HCT 91.4 (L) 78.2 - 95.6 %   MCV 87.9 80.0 - 100.0 fL   MCH 28.2 26.0 - 34.0 pg   MCHC 32.2 30.0 - 36.0 g/dL   RDW 21.3 08.6 - 57.8 %   Platelets 136 (L) 150 - 400 K/uL   nRBC 0.0 0.0 - 0.2 %   Neutrophils Relative % 42 %   Neutro Abs 2.6 1.7 - 7.7 K/uL   Lymphocytes Relative 53 %   Lymphs Abs 3.3 0.7 - 4.0 K/uL   Monocytes Relative 3 %   Monocytes Absolute 0.2 0.1 - 1.0 K/uL   Eosinophils Relative 0 %   Eosinophils Absolute 0.0 0.0 - 0.5 K/uL   Basophils Relative 2 %   Basophils Absolute 0.1 0.0 - 0.1 K/uL  Comprehensive metabolic panel     Status: Abnormal   Collection Time: 05/20/18  2:45 PM  Result Value Ref Range   Sodium 133 (L) 135 - 145 mmol/L   Potassium 3.4 (L) 3.5 - 5.1 mmol/L   Chloride 103 98 - 111 mmol/L   CO2 21 (L) 22 - 32 mmol/L   Glucose, Bld 111 (H) 70 - 99 mg/dL   BUN 6 6 - 20 mg/dL   Creatinine, Ser 4.69 0.44 - 1.00 mg/dL   Calcium 8.4 (L) 8.9 - 10.3 mg/dL   Total Protein 6.4 (L) 6.5 - 8.1 g/dL   Albumin 2.6 (L) 3.5 - 5.0 g/dL   AST 26 15 - 41 U/L   ALT 21 0 - 44 U/L   Alkaline Phosphatase 74 38 - 126 U/L   Total Bilirubin 0.2 (L) 0.3 - 1.2 mg/dL   GFR calc non Af Amer >60 >60 mL/min   GFR calc Af Amer >60 >60 mL/min   Anion gap 9 5 - 15  Urinalysis, Routine w reflex microscopic     Status: None   Collection Time: 05/20/18  3:02 PM  Result Value Ref Range   Color, Urine YELLOW YELLOW   APPearance CLEAR CLEAR   Specific Gravity, Urine 1.009 1.005 - 1.030   pH 7.0 5.0 - 8.0   Glucose, UA NEGATIVE NEGATIVE mg/dL   Hgb urine dipstick NEGATIVE NEGATIVE   Bilirubin Urine NEGATIVE NEGATIVE   Ketones,  ur NEGATIVE  NEGATIVE mg/dL   Protein, ur NEGATIVE NEGATIVE mg/dL   Nitrite NEGATIVE NEGATIVE   Leukocytes, UA NEGATIVE NEGATIVE    Dg Chest 2 View  Result Date: 05/20/2018 CLINICAL DATA:  Shortness of breath and chest discomfort for several weeks, [redacted] weeks pregnant EXAM: CHEST - 2 VIEW COMPARISON:  None FINDINGS: Abdomen shielded. Normal heart size, mediastinal contours, and pulmonary vascularity. Lungs clear. No pleural effusion or pneumothorax. Bones unremarkable. IMPRESSION: Normal exam. Electronically Signed   By: Ulyses SouthwardMark  Boles M.D.   On: 05/20/2018 15:31    Assessment and Plan  --17 y.o. G1P0 at 1647w2d  --Reactive fetal tracing --No concerning findings on physical exam or labs collected today --Reviewed signs/symptoms of hemodilution with patient --Discharge home in stable condition, return to MAU for worsening symptoms  Calvert CantorSamantha C Eleina Jergens, CNM 05/20/2018, 4:07 PM

## 2018-05-20 NOTE — MAU Note (Signed)
Pt presents to MAU with complaints of SOB for a couple of weeks and not able to catch her breath. Denies any cough. +FM

## 2018-05-20 NOTE — Discharge Instructions (Signed)

## 2018-06-11 NOTE — L&D Delivery Note (Signed)
Patient was C/C/+2 and pushed for 35 minutes with epidural.    NSVD  female infant, Apgars 9,9, weight P.   The patient had a first degree perineal laceration repaired with 2-0 vicryl R. Fundus was firm. EBL was expected amount. Placenta was delivered intact. Vagina was clear.  Delayed cord clamping done for 30-60 seconds while warming baby. Baby was vigorous and doing skin to skin with mother.  Christy Moran

## 2018-06-17 LAB — OB RESULTS CONSOLE GBS: GBS: POSITIVE

## 2018-07-02 ENCOUNTER — Inpatient Hospital Stay (HOSPITAL_COMMUNITY)
Admission: AD | Admit: 2018-07-02 | Discharge: 2018-07-02 | Disposition: A | Discharge status: Home / Self Care | Payer: Medicaid Other | Attending: Obstetrics and Gynecology | Admitting: Obstetrics and Gynecology

## 2018-07-02 ENCOUNTER — Encounter (HOSPITAL_COMMUNITY): Payer: Self-pay | Admitting: Emergency Medicine

## 2018-07-02 DIAGNOSIS — O471 False labor at or after 37 completed weeks of gestation: Secondary | ICD-10-CM

## 2018-07-02 DIAGNOSIS — O479 False labor, unspecified: Secondary | ICD-10-CM

## 2018-07-02 DIAGNOSIS — N898 Other specified noninflammatory disorders of vagina: Secondary | ICD-10-CM

## 2018-07-02 DIAGNOSIS — Z3A38 38 weeks gestation of pregnancy: Secondary | ICD-10-CM

## 2018-07-02 DIAGNOSIS — O26893 Other specified pregnancy related conditions, third trimester: Secondary | ICD-10-CM | POA: Insufficient documentation

## 2018-07-02 HISTORY — DX: Anemia, unspecified: D64.9

## 2018-07-02 LAB — WET PREP, GENITAL
Clue Cells Wet Prep HPF POC: NONE SEEN
SPERM: NONE SEEN
Trich, Wet Prep: NONE SEEN
YEAST WET PREP: NONE SEEN

## 2018-07-02 NOTE — MAU Note (Signed)
Pt reports contractions, ? Leaking fluid since 1430.

## 2018-07-02 NOTE — Discharge Instructions (Signed)
Braxton Hicks Contractions Contractions of the uterus can occur throughout pregnancy, but they are not always a sign that you are in labor. You may have practice contractions called Braxton Hicks contractions. These false labor contractions are sometimes confused with true labor. What are Braxton Hicks contractions? Braxton Hicks contractions are tightening movements that occur in the muscles of the uterus before labor. Unlike true labor contractions, these contractions do not result in opening (dilation) and thinning of the cervix. Toward the end of pregnancy (32-34 weeks), Braxton Hicks contractions can happen more often and may become stronger. These contractions are sometimes difficult to tell apart from true labor because they can be very uncomfortable. You should not feel embarrassed if you go to the hospital with false labor. Sometimes, the only way to tell if you are in true labor is for your health care provider to look for changes in the cervix. The health care provider will do a physical exam and may monitor your contractions. If you are not in true labor, the exam should show that your cervix is not dilating and your water has not broken. If there are no other health problems associated with your pregnancy, it is completely safe for you to be sent home with false labor. You may continue to have Braxton Hicks contractions until you go into true labor. How to tell the difference between true labor and false labor True labor  Contractions last 30-70 seconds.  Contractions become very regular.  Discomfort is usually felt in the top of the uterus, and it spreads to the lower abdomen and low back.  Contractions do not go away with walking.  Contractions usually become more intense and increase in frequency.  The cervix dilates and gets thinner. False labor  Contractions are usually shorter and not as strong as true labor contractions.  Contractions are usually irregular.  Contractions  are often felt in the front of the lower abdomen and in the groin.  Contractions may go away when you walk around or change positions while lying down.  Contractions get weaker and are shorter-lasting as time goes on.  The cervix usually does not dilate or become thin. Follow these instructions at home:   Take over-the-counter and prescription medicines only as told by your health care provider.  Keep up with your usual exercises and follow other instructions from your health care provider.  Eat and drink lightly if you think you are going into labor.  If Braxton Hicks contractions are making you uncomfortable: ? Change your position from lying down or resting to walking, or change from walking to resting. ? Sit and rest in a tub of warm water. ? Drink enough fluid to keep your urine pale yellow. Dehydration may cause these contractions. ? Do slow and deep breathing several times an hour.  Keep all follow-up prenatal visits as told by your health care provider. This is important. Contact a health care provider if:  You have a fever.  You have continuous pain in your abdomen. Get help right away if:  Your contractions become stronger, more regular, and closer together.  You have fluid leaking or gushing from your vagina.  You pass blood-tinged mucus (bloody show).  You have bleeding from your vagina.  You have low back pain that you never had before.  You feel your baby's head pushing down and causing pelvic pressure.  Your baby is not moving inside you as much as it used to. Summary  Contractions that occur before labor are   called Braxton Hicks contractions, false labor, or practice contractions.  Braxton Hicks contractions are usually shorter, weaker, farther apart, and less regular than true labor contractions. True labor contractions usually become progressively stronger and regular, and they become more frequent.  Manage discomfort from Braxton Hicks contractions  by changing position, resting in a warm bath, drinking plenty of water, or practicing deep breathing. This information is not intended to replace advice given to you by your health care provider. Make sure you discuss any questions you have with your health care provider. Document Released: 10/11/2016 Document Revised: 03/12/2017 Document Reviewed: 10/11/2016 Elsevier Interactive Patient Education  2019 [employer]  

## 2018-07-02 NOTE — MAU Provider Note (Addendum)
History     CSN: [id number]  Arrival date and time: 07/02/18 1517   First Provider Initiated Contact with Patient 07/02/18 1653      Chief Complaint  Patient presents with  . Rupture of Membranes  . Contractions   Christy Moran is a 18 y.o. G1P0 at [redacted]w[redacted]d who presents for Rupture of Membranes and Contractions.  Patient states she started having leaking around 2pm that has been continuance.  She reports intermittent contractions, good fetal movement, and denies VB.       OB History    Gravida  1   Para      Term      Preterm      AB      Living        SAB      TAB      Ectopic      Multiple      Live Births              Past Medical History:  Diagnosis Date  . Anemia     Past Surgical History:  Procedure Laterality Date  . NO PAST SURGERIES      Family History  Problem Relation Age of Onset  . Hypertension Mother   . Hypertension Father   . Stroke Maternal Uncle   . Cancer Maternal Grandfather        prostate  . Cancer Paternal Grandfather   . Drug abuse Neg Hx     Social History   Tobacco Use  . Smoking status: Never Smoker  . Smokeless tobacco: Never Used  Substance Use Topics  . Alcohol use: No  . Drug use: Never    Allergies: No Known Allergies  Medications Prior to Admission  Medication Sig Dispense Refill Last Dose  . Doxylamine-Pyridoxine (DICLEGIS) 10-10 MG TBEC Take 1 tablet by mouth every 8 (eight) hours as needed (nausea, vomiting). 15 tablet 0 Past Month at Unknown time  . ferrous sulfate 325 (65 FE) MG tablet Take 325 mg by mouth daily with breakfast.   Past Month at Unknown time  . Prenatal Vit-Fe Fumarate-FA (PRENATAL MULTIVITAMIN) TABS tablet Take 1 tablet by mouth daily at 12 noon.   Past Week at Unknown time    Review of Systems  Genitourinary: Positive for vaginal discharge. Negative for vaginal bleeding.   Physical Exam   Blood pressure 123/67, pulse 89, temperature 98.5 F (36.9 C), temperature source  Oral, resp. rate 16, height 5\' 4"  (1.626 m), weight 88 kg, last menstrual period 10/06/2017, SpO2 100 %.  Physical Exam  Constitutional: She is oriented to person, place, and time. She appears well-developed and well-nourished.  HENT:  Head: Normocephalic and atraumatic.  Eyes: Conjunctivae are normal.  Neck: Normal range of motion.  Cardiovascular: Normal rate, regular rhythm and normal heart sounds.  Respiratory: Effort normal.  GI: Soft.  Genitourinary:    Vaginal discharge present.     No vaginal bleeding.  No bleeding in the vagina.    Genitourinary Comments: Sterile Speculum Exam: -Vaginal Vault: Pink mucosa.  Moderate amt thick white mucoid discharge in vault, No pooling-wet prep collected -Cervix:Pink, no lesions, cysts, or polyps.  No active bleeding  from os -Valsalva Negative -Bimanual Exam: 3/50/-3 Cephalic    Musculoskeletal: Normal range of motion.        General: No edema.  Neurological: She is alert and oriented to person, place, and time.  Skin: Skin is warm and dry.  Psychiatric: She has a normal  mood and affect. Her behavior is normal.    MAU Course  Procedures Results for orders placed or performed during the hospital encounter of 07/02/18 (from the past 24 hour(s))  Wet prep, genital     Status: Abnormal   Collection Time: 07/02/18  4:46 PM  Result Value Ref Range   Yeast Wet Prep HPF POC NONE SEEN NONE SEEN   Trich, Wet Prep NONE SEEN NONE SEEN   Clue Cells Wet Prep HPF POC NONE SEEN NONE SEEN   WBC, Wet Prep HPF POC MODERATE (A) NONE SEEN   Sperm NONE SEEN     MDM Pelvic Exam Fern Wet prep  Assessment and Plan  IUP at 38.3wks Vaginal Discharge R/o Rupture  -Exam findings discussed -Informed that exam looks negative for rupture -Informed that in context of intermittent contractions and cervical dilation, discharge would be appropriate. -No other q/c -Wet prep pending -Fern Negative Initial -Will await results.   Follow Up (5:37 PM) Crist Fat  Negative Wet prep Negative  -Nurse called and advised *Inform patient of negative results and vaginal discharge normal increase found with pregnancy *Readjust monitor to obtain reactive NST *Give patient option for ambulation or discharge after reactive NST -Will reassess   Follow Up (6:23 PM) -Reactive NST -Patient desires to ambulate -Okay to ambulate unit   Follow Up (8:02 PM)  -Returns from ambulation and cervical exam remains the same -Labor Precautions given -Encouraged to call primary ob or return to MAU if symptoms worsen or with the onset of new symptoms. -Discharged to home in stable condition  Cherre Robins MSN, CNM 07/02/2018, 4:53 PM

## 2018-07-14 ENCOUNTER — Other Ambulatory Visit: Payer: Self-pay

## 2018-07-14 ENCOUNTER — Inpatient Hospital Stay (HOSPITAL_COMMUNITY)
Admission: AD | Admit: 2018-07-14 | Discharge: 2018-07-14 | Disposition: A | Discharge status: Home / Self Care | Payer: Medicaid Other | Attending: Obstetrics | Admitting: Obstetrics

## 2018-07-14 ENCOUNTER — Encounter (HOSPITAL_COMMUNITY): Payer: Self-pay | Admitting: *Deleted

## 2018-07-14 DIAGNOSIS — Z3689 Encounter for other specified antenatal screening: Secondary | ICD-10-CM

## 2018-07-14 DIAGNOSIS — R110 Nausea: Secondary | ICD-10-CM | POA: Insufficient documentation

## 2018-07-14 DIAGNOSIS — Z3A40 40 weeks gestation of pregnancy: Secondary | ICD-10-CM | POA: Diagnosis not present

## 2018-07-14 DIAGNOSIS — O480 Post-term pregnancy: Secondary | ICD-10-CM

## 2018-07-14 DIAGNOSIS — O219 Vomiting of pregnancy, unspecified: Secondary | ICD-10-CM | POA: Diagnosis not present

## 2018-07-14 DIAGNOSIS — R51 Headache: Secondary | ICD-10-CM | POA: Diagnosis present

## 2018-07-14 DIAGNOSIS — O26893 Other specified pregnancy related conditions, third trimester: Secondary | ICD-10-CM | POA: Diagnosis not present

## 2018-07-14 LAB — URINALYSIS, ROUTINE W REFLEX MICROSCOPIC
Bilirubin Urine: NEGATIVE
Glucose, UA: NEGATIVE mg/dL
Hgb urine dipstick: NEGATIVE
KETONES UR: NEGATIVE mg/dL
Leukocytes, UA: NEGATIVE
Nitrite: NEGATIVE
Protein, ur: NEGATIVE mg/dL
Specific Gravity, Urine: <1.005 — ABNORMAL LOW (ref 1.005–1.030)
pH: 6 (ref 5.0–8.0)

## 2018-07-14 MED ORDER — ONDANSETRON 4 MG PO TBDP
4.0000 mg | ORAL_TABLET | Freq: Once | ORAL | Status: AC
  Administered 2018-07-14: 4 mg via ORAL
  Filled 2018-07-14: qty 1

## 2018-07-14 MED ORDER — ACETAMINOPHEN 500 MG PO TABS
1000.0000 mg | ORAL_TABLET | Freq: Once | ORAL | Status: AC
  Administered 2018-07-14: 1000 mg via ORAL
  Filled 2018-07-14: qty 2

## 2018-07-14 NOTE — MAU Provider Note (Signed)
History     CSN: [id number]  Arrival date and time: 07/14/18 [id number]   First Provider Initiated Contact with Patient 07/14/18 [phone]      Chief Complaint  Patient presents with  . Headache  . Nausea   HPI Christy Moran is a 18 y.o. G1P0 at 7332w1d who presents to MAU with chief complaint of nausea and a headache. These are new problems, onset last night.  She denies vomiting, abdominal pain, decreased fetal movement, LOF, fever or recent illness. She has not taken medication or tried other treatments for this problem.   Patient's headache coincides with her nausea. Pain is frontal, bilateral, does not radiate. She denies visual disturbances. She has not taken medication or tried other treatments for the problem.  Patient is requesting peanut butter and graham crackers upon Provider introduction in MAU exam room.  OB History    Gravida  1   Para      Term      Preterm      AB      Living        SAB      TAB      Ectopic      Multiple      Live Births              Past Medical History:  Diagnosis Date  . Anemia     Past Surgical History:  Procedure Laterality Date  . NO PAST SURGERIES      Family History  Problem Relation Age of Onset  . Hypertension Mother   . Hypertension Father   . Stroke Maternal Uncle   . Cancer Maternal Grandfather        prostate  . Cancer Paternal Grandfather   . Drug abuse Neg Hx     Social History   Tobacco Use  . Smoking status: Never Smoker  . Smokeless tobacco: Never Used  Substance Use Topics  . Alcohol use: No  . Drug use: Never    Allergies: No Known Allergies  Medications Prior to Admission  Medication Sig Dispense Refill Last Dose  . Doxylamine-Pyridoxine (DICLEGIS) 10-10 MG TBEC Take 1 tablet by mouth every 8 (eight) hours as needed (nausea, vomiting). 15 tablet 0 Past Month at Unknown time  . ferrous sulfate 325 (65 FE) MG tablet Take 325 mg by mouth daily with breakfast.   Past Month at Unknown time   . Prenatal Vit-Fe Fumarate-FA (PRENATAL MULTIVITAMIN) TABS tablet Take 1 tablet by mouth daily at 12 noon.   Past Week at Unknown time    Review of Systems  Gastrointestinal: Positive for nausea. Negative for abdominal pain, diarrhea and vomiting.  Genitourinary: Negative for difficulty urinating, vaginal bleeding, vaginal discharge and vaginal pain.  Musculoskeletal: Negative for back pain.  Neurological: Positive for headaches.  All other systems reviewed and are negative.  Physical Exam   Blood pressure 132/83, pulse 86, temperature 97.7 F (36.5 C), temperature source Oral, resp. rate 18, height 5\' 4"  (1.626 m), weight 89.9 kg, last menstrual period 10/06/2017, SpO2 100 %.  Physical Exam  Nursing note and vitals reviewed. Constitutional: She is oriented to person, place, and time. She appears well-developed and well-nourished.  Cardiovascular: Normal rate.  Respiratory: Effort normal.  GI:  Gravid  Neurological: She is alert and oriented to person, place, and time.  Skin: Skin is warm and dry.  Psychiatric: She has a normal mood and affect. Her behavior is normal. Thought content normal.    MAU  Course/MDM    --Reactive tracing: baseline 130, moderate variability, positive accels,  No decels --Toco: occasional contractions, not felt by patient, palpate mild  Patient Vitals for the past 24 hrs:  BP Temp Temp src Pulse Resp SpO2 Height Weight  07/14/18 1049 130/79 98 F (36.7 C) Oral 78 19 99 % - -  07/14/18 0918 132/83 97.7 F (36.5 C) Oral 86 18 100 % 5\' 4"  (1.626 m) 89.9 kg     Results for orders placed or performed during the hospital encounter of 07/14/18 (from the past 24 hour(s))  Urinalysis, Routine w reflex microscopic     Status: Abnormal   Collection Time: 07/14/18  9:42 AM  Result Value Ref Range   Color, Urine YELLOW YELLOW   APPearance CLEAR CLEAR   Specific Gravity, Urine <1.005 (L) 1.005 - 1.030   pH 6.0 5.0 - 8.0   Glucose, UA NEGATIVE NEGATIVE  mg/dL   Hgb urine dipstick NEGATIVE NEGATIVE   Bilirubin Urine NEGATIVE NEGATIVE   Ketones, ur NEGATIVE NEGATIVE mg/dL   Protein, ur NEGATIVE NEGATIVE mg/dL   Nitrite NEGATIVE NEGATIVE   Leukocytes, UA NEGATIVE NEGATIVE    Meds ordered this encounter  Medications  . acetaminophen (TYLENOL) tablet 1,000 mg  . ondansetron (ZOFRAN-ODT) disintegrating tablet 4 mg    Assessment and Plan  --18 y.o. G1P0 at 1179w1d  --Reactive tracing --Normotensive --Patient declines cervical check --Tolerating PO, denies headache prior to discharge --Discharge home in stable condition  F/U: Patient has OB appt tomorrow  Calvert CantorSamantha C Anh Mangano, CNM 07/14/2018, 12:11 PM

## 2018-07-14 NOTE — MAU Note (Signed)
Presents with c/o H/A since last night, hasn't taken meds.  Aslo c/o nausea this morning, no emesis.  Reports +FM last night.  Denies LOF or VB.

## 2018-07-14 NOTE — Discharge Instructions (Signed)
First Stage of Labor °Labor is your body's natural process of moving your baby and other structures, including the placenta and umbilical cord, out of your uterus. There are three stages of labor. How long each stage lasts is different for every woman. But certain events happen during each stage that are the same for everyone. °· The first stage starts when true labor begins. This stage ends when your cervix, which is the opening from your uterus into your vagina, is completely open (dilated). °· The second stage begins when your cervix is fully dilated and you start pushing. This stage ends when your baby is born. °· The third stage is the delivery of the organ that nourished your baby during pregnancy (placenta). °First stage of labor °As your due date gets closer, you may start to notice certain physical changes that mean labor is going to start soon. You may feel that your baby has dropped lower into your pelvis. You may experience irregular, often painless, contractions that go away when you walk around or lie down (Braxton Hicks contractions). This is also called false labor. °The first stage of labor begins when you start having contractions that come at regular (evenly spaced) intervals and your cervix starts to get thinner and wider in preparation for your baby to pass through. Birth care providers measure the dilation of your cervix in centimeters (cm). One centimeter is a little less than one-half of an inch. The first stage ends when your cervix is dilated to 10 cm. The first stage of labor is divided into three phases: °· Early phase. °· Active phase. °· Transitional phase. °The length of the first stage of labor varies. It may be longer if this is your first pregnancy. You may spend most of this stage at home trying to relax and stay comfortable. °How does this affect me? °During the first stage of labor, you will move through three phases. °What happens in the early phase? °· You will start to have  regular contractions that last 30-60 seconds. Contractions may come every 5-20 minutes. Keep track of your contractions and call your birth care provider. °· Your water may break during this phase. °· You may notice a clear or slightly bloody discharge of mucus (mucus plug) from your vagina. °· Your cervix will dilate to 3-6 cm. °What happens in the active phase? °The active phase usually lasts 3-5 hours. You may go to the hospital or birth center around this time. During the active phase: °· Your contractions will become stronger, longer, and more uncomfortable. °· Your contractions may last 45-90 seconds and come every 3-5 minutes. °· You may feel lower back pain. °· Your birth care providers may examine your cervix and feel your belly to find the position of your baby. °· You may have a monitor strapped to your belly to measure your contractions and your baby's heart rate. °· You may start using your pain management options. °· Your cervix may be dilated to 6 cm and may start to dilate more quickly. °What happens in the transitional phase? °The transitional phase typically lasts from 30 minutes to 2 hours. At the end of this phase, your cervix will be fully dilated to 10 cm. During the transitional phase: °· Contractions will get stronger and longer. °· Contractions may last 60-90 seconds and come less than 2 minutes apart. °· You may feel hot flashes, chills, or nausea. °How does this affect my baby? °During the first stage of labor, your baby will   gradually move down into your birth canal. °Follow these instructions at home and in the hospital or birth center: ° °· When labor first begins, try to stay calm. You are still in the early phase. If it is night, try to get some sleep. If it is day, try to relax and save your energy. You may want to make some calls and get ready to go to the hospital or birth center. °· When you are in the early phase, try these methods to help ease discomfort: °? Deep breathing and  muscle relaxation. °? Taking a walk. °? Taking a warm bath or shower. °· Drink some fluids and have a light snack if you feel like it. °· Keep track of your contractions. °· Based on the plan you created with your birth care provider, call when your contractions indicate it is time. °· If your water breaks, note the time, color, and odor of the fluid. °· When you are in the active phase, do your breathing exercises and rely on your support people and your team of birth care providers. °Contact a health care provider if: °· Your contractions are strong and regular. °· You have lower back pain or cramping. °· Your water breaks. °· You lose your mucus plug. °Get help right away if you: °· Have a severe headache that does not go away. °· Have changes in your vision. °· Have severe pain in your upper belly. °· Do not feel the baby move. °· Have bright red bleeding. °Summary °· The first stage of labor starts when true labor begins, and it ends when your cervix is dilated to 10 cm. °· The first stage of labor has three phases: early, active, and transitional. °· Your baby moves into the birth canal during the first stage of labor. °· You may have contractions that become stronger and longer. You may also lose your mucus plug and have your water break. °· Call your birth care provider when your contractions are frequent and strong enough to go to the hospital or birth center. °This information is not intended to replace advice given to you by your health care provider. Make sure you discuss any questions you have with your health care provider. °Document Released: 08/11/2017 Document Revised: 02/15/2018 Document Reviewed: 08/11/2017 °Elsevier Interactive Patient Education © 2019 [employer] ° °

## 2018-07-15 ENCOUNTER — Other Ambulatory Visit: Payer: Self-pay | Admitting: Obstetrics and Gynecology

## 2018-07-16 ENCOUNTER — Encounter (HOSPITAL_COMMUNITY): Payer: Self-pay | Admitting: *Deleted

## 2018-07-16 ENCOUNTER — Telehealth (HOSPITAL_COMMUNITY): Payer: Self-pay | Admitting: *Deleted

## 2018-07-16 NOTE — Telephone Encounter (Signed)
Preadmission screen  

## 2018-07-17 ENCOUNTER — Inpatient Hospital Stay (HOSPITAL_COMMUNITY): Payer: Medicaid Other | Admitting: Anesthesiology

## 2018-07-17 ENCOUNTER — Encounter (HOSPITAL_COMMUNITY): Payer: Self-pay | Admitting: *Deleted

## 2018-07-17 ENCOUNTER — Inpatient Hospital Stay (HOSPITAL_COMMUNITY)
Admission: AD | Admit: 2018-07-17 | Discharge: 2018-07-19 | DRG: 807 | Disposition: A | Discharge status: Home / Self Care | Payer: Medicaid Other | Attending: Obstetrics and Gynecology | Admitting: Obstetrics and Gynecology

## 2018-07-17 ENCOUNTER — Other Ambulatory Visit: Payer: Self-pay

## 2018-07-17 DIAGNOSIS — O480 Post-term pregnancy: Secondary | ICD-10-CM

## 2018-07-17 DIAGNOSIS — Z3A40 40 weeks gestation of pregnancy: Secondary | ICD-10-CM | POA: Diagnosis not present

## 2018-07-17 DIAGNOSIS — Z3483 Encounter for supervision of other normal pregnancy, third trimester: Secondary | ICD-10-CM | POA: Diagnosis present

## 2018-07-17 LAB — CBC
HEMATOCRIT: 31.5 % — AB (ref 36.0–46.0)
Hemoglobin: 10 g/dL — ABNORMAL LOW (ref 12.0–15.0)
MCH: 27.1 pg (ref 26.0–34.0)
MCHC: 31.7 g/dL (ref 30.0–36.0)
MCV: 85.4 fL (ref 80.0–100.0)
Platelets: 141 10*3/uL — ABNORMAL LOW (ref 150–400)
RBC: 3.69 MIL/uL — ABNORMAL LOW (ref 3.87–5.11)
RDW: 15 % (ref 11.5–15.5)
WBC: 6.8 10*3/uL (ref 4.0–10.5)
nRBC: 0 % (ref 0.0–0.2)

## 2018-07-17 LAB — TYPE AND SCREEN
ABO/RH(D): A POS
ANTIBODY SCREEN: NEGATIVE

## 2018-07-17 LAB — ABO/RH: ABO/RH(D): A POS

## 2018-07-17 MED ORDER — EPHEDRINE 5 MG/ML INJ
10.0000 mg | INTRAVENOUS | Status: DC | PRN
  Filled 2018-07-17: qty 2

## 2018-07-17 MED ORDER — DIPHENHYDRAMINE HCL 50 MG/ML IJ SOLN: 12.5000 mg | INTRAMUSCULAR | Status: DC | PRN

## 2018-07-17 MED ORDER — OXYCODONE-ACETAMINOPHEN 5-325 MG PO TABS: 1.0000 | ORAL_TABLET | ORAL | Status: DC | PRN

## 2018-07-17 MED ORDER — SODIUM CHLORIDE 0.9 % IV SOLN
5.0000 10*6.[IU] | Freq: Once | INTRAVENOUS | Status: DC
  Filled 2018-07-17: qty 5

## 2018-07-17 MED ORDER — LACTATED RINGERS IV SOLN: 500.0000 mL | Freq: Once | INTRAVENOUS | Status: DC

## 2018-07-17 MED ORDER — LACTATED RINGERS IV SOLN
INTRAVENOUS | Status: DC
  Administered 2018-07-17 (×2): via INTRAVENOUS

## 2018-07-17 MED ORDER — ONDANSETRON HCL 4 MG/2ML IJ SOLN: 4.0000 mg | Freq: Four times a day (QID) | INTRAMUSCULAR | Status: DC | PRN

## 2018-07-17 MED ORDER — SODIUM CHLORIDE 0.9 % IV SOLN
2.0000 g | Freq: Four times a day (QID) | INTRAVENOUS | Status: DC
  Administered 2018-07-17: 2 g via INTRAVENOUS
  Filled 2018-07-17 (×4): qty 2000
  Filled 2018-07-17: qty 2
  Filled 2018-07-17: qty 2000

## 2018-07-17 MED ORDER — PHENYLEPHRINE 40 MCG/ML (10ML) SYRINGE FOR IV PUSH (FOR BLOOD PRESSURE SUPPORT)
80.0000 ug | PREFILLED_SYRINGE | INTRAVENOUS | Status: DC | PRN
  Filled 2018-07-17 (×2): qty 10

## 2018-07-17 MED ORDER — BUTORPHANOL TARTRATE 1 MG/ML IJ SOLN
1.0000 mg | INTRAMUSCULAR | Status: DC | PRN
  Administered 2018-07-17: 1 mg via INTRAVENOUS
  Filled 2018-07-17: qty 1

## 2018-07-17 MED ORDER — LIDOCAINE HCL (PF) 1 % IJ SOLN
30.0000 mL | INTRAMUSCULAR | Status: DC | PRN
  Filled 2018-07-17: qty 30

## 2018-07-17 MED ORDER — OXYTOCIN 40 UNITS IN NORMAL SALINE INFUSION - SIMPLE MED
2.5000 [IU]/h | INTRAVENOUS | Status: DC
  Filled 2018-07-17: qty 1000

## 2018-07-17 MED ORDER — PHENYLEPHRINE 40 MCG/ML (10ML) SYRINGE FOR IV PUSH (FOR BLOOD PRESSURE SUPPORT)
80.0000 ug | PREFILLED_SYRINGE | INTRAVENOUS | Status: DC | PRN
  Filled 2018-07-17: qty 10

## 2018-07-17 MED ORDER — SODIUM CHLORIDE 0.9 % IV SOLN
2.0000 g | Freq: Once | INTRAVENOUS | Status: AC
  Administered 2018-07-17: 2 g via INTRAVENOUS
  Filled 2018-07-17: qty 2

## 2018-07-17 MED ORDER — OXYCODONE-ACETAMINOPHEN 5-325 MG PO TABS: 2.0000 | ORAL_TABLET | ORAL | Status: DC | PRN

## 2018-07-17 MED ORDER — PENICILLIN G 3 MILLION UNITS IVPB - SIMPLE MED: 3.0000 10*6.[IU] | INTRAVENOUS | Status: DC

## 2018-07-17 MED ORDER — OXYTOCIN BOLUS FROM INFUSION: 500.0000 mL | Freq: Once | INTRAVENOUS | Status: DC

## 2018-07-17 MED ORDER — LIDOCAINE HCL (PF) 1 % IJ SOLN
INTRAMUSCULAR | Status: DC | PRN
  Administered 2018-07-17 (×2): 5 mL via EPIDURAL

## 2018-07-17 MED ORDER — SOD CITRATE-CITRIC ACID 500-334 MG/5ML PO SOLN: 30.0000 mL | ORAL | Status: DC | PRN

## 2018-07-17 MED ORDER — ACETAMINOPHEN 325 MG PO TABS: 650.0000 mg | ORAL_TABLET | ORAL | Status: DC | PRN

## 2018-07-17 MED ORDER — FLEET ENEMA 7-19 GM/118ML RE ENEM: 1.0000 | ENEMA | RECTAL | Status: DC | PRN

## 2018-07-17 MED ORDER — LACTATED RINGERS IV SOLN: 500.0000 mL | INTRAVENOUS | Status: DC | PRN

## 2018-07-17 MED ORDER — FENTANYL 2.5 MCG/ML BUPIVACAINE 1/10 % EPIDURAL INFUSION (WH - ANES)
14.0000 mL/h | INTRAMUSCULAR | Status: DC | PRN
  Administered 2018-07-17 (×2): 14 mL/h via EPIDURAL
  Filled 2018-07-17 (×2): qty 100

## 2018-07-17 NOTE — H&P (Signed)
18 y.o. [redacted]w[redacted]d  G1P0 comes in c/o labor.  Otherwise has good fetal movement and no bleeding.  Past Medical History:  Diagnosis Date  . Anemia   . Hx of gonorrhea     Past Surgical History:  Procedure Laterality Date  . NO PAST SURGERIES      OB History  Gravida Para Term Preterm AB Living  1            SAB TAB Ectopic Multiple Live Births               # Outcome Date GA Lbr Len/2nd Weight Sex Delivery Anes PTL Lv  1 Current             Social History   Socioeconomic History  . Marital status: Single    Spouse name: Not on file  . Number of children: Not on file  . Years of education: Not on file  . Highest education level: Not on file  Occupational History  . Not on file  Social Needs  . Financial resource strain: Not hard at all  . Food insecurity:    Worry: Never true    Inability: Never true  . Transportation needs:    Medical: No    Non-medical: Not on file  Tobacco Use  . Smoking status: Never Smoker  . Smokeless tobacco: Never Used  Substance and Sexual Activity  . Alcohol use: No  . Drug use: Never  . Sexual activity: Yes  Lifestyle  . Physical activity:    Days per week: Not on file    Minutes per session: Not on file  . Stress: Only a little  Relationships  . Social connections:    Talks on phone: Not on file    Gets together: Not on file    Attends religious service: Not on file    Active member of club or organization: Not on file    Attends meetings of clubs or organizations: Not on file    Relationship status: Not on file  . Intimate partner violence:    Fear of current or ex partner: No    Emotionally abused: No    Physically abused: No    Forced sexual activity: No  Other Topics Concern  . Not on file  Social History Narrative  . Not on file   Patient has no known allergies.    Prenatal Transfer Tool  Maternal Diabetes: No Genetic Screening: Normal Maternal Ultrasounds/Referrals: Normal Fetal Ultrasounds or other Referrals:   None Maternal Substance Abuse:  No Significant Maternal Medications:  None Significant Maternal Lab Results: None  Other PNC: uncomplicated.    Vitals:   07/17/18 1401 07/17/18 1406 07/17/18 1411 07/17/18 1501  BP: 131/77 128/77 120/77 125/60  Pulse: 83 89 83 76  Resp: 16 16 16 16   Temp:      SpO2: 100% 100% 100%   Weight:        Lungs/Cor:  NAD Abdomen:  soft, gravid Ex:  no cords, erythema SVE:  7.5/100/0 per nurse FHTs:  120s, good STV, NST R; Cat 1 tracing. Toco:  q 3-4   A/P   Term labor.  GBS - got AMP for progressing quickly.  Loney Laurence

## 2018-07-17 NOTE — Anesthesia Preprocedure Evaluation (Signed)
Anesthesia Evaluation  Patient identified by MRN, date of birth, ID band Patient awake    Reviewed: Allergy & Precautions, H&P , NPO status , Patient's Chart, lab work & pertinent test results  Airway Mallampati: I   Neck ROM: full    Dental   Pulmonary neg pulmonary ROS,    breath sounds clear to auscultation       Cardiovascular negative cardio ROS   Rhythm:regular Rate:Normal     Neuro/Psych    GI/Hepatic   Endo/Other    Renal/GU      Musculoskeletal   Abdominal   Peds  Hematology   Anesthesia Other Findings   Reproductive/Obstetrics                             Anesthesia Physical Anesthesia Plan  ASA: I  Anesthesia Plan: Epidural   Post-op Pain Management:    Induction: Intravenous  PONV Risk Score and Plan: 2 and Treatment may vary due to age or medical condition  Airway Management Planned: Natural Airway  Additional Equipment:   Intra-op Plan:   Post-operative Plan:   Informed Consent: I have reviewed the patients History and Physical, chart, labs and discussed the procedure including the risks, benefits and alternatives for the proposed anesthesia with the patient or authorized representative who has indicated his/her understanding and acceptance.       Plan Discussed with: Anesthesiologist  Anesthesia Plan Comments:         Anesthesia Quick Evaluation

## 2018-07-17 NOTE — Anesthesia Pain Management Evaluation Note (Signed)
  CRNA Pain Management Visit Note  Patient: Christy Moran, 18 y.o., female  "Hello I am a member of the anesthesia team at [facility]. We have an anesthesia team available at all times to provide care throughout the hospital, including epidural management and anesthesia for C-section. I don't know your plan for the delivery whether it a natural birth, water birth, IV sedation, nitrous supplementation, doula or epidural, but we want to meet your pain goals."   1.Was your pain managed to your expectations on prior hospitalizations?   No prior hospitalizations  2.What is your expectation for pain management during this hospitalization?     Epidural  3.How can we help you reach that goal?   Record the patient's initial score and the patient's pain goal.   Pain: 10  Pain Goal: 5 [facility] wants you to be able to say your pain was always managed very well.  Laban Emperor 07/17/2018

## 2018-07-17 NOTE — MAU Note (Signed)
Contractions woke her around 3; now every 5-7910min.  No bleeding or leaking. Was 2-3 cm when last checked.

## 2018-07-17 NOTE — Anesthesia Procedure Notes (Signed)
Epidural Patient location during procedure: OB Start time: 07/17/2018 1:27 PM End time: 07/17/2018 1:36 PM  Staffing Anesthesiologist: Achille RichHodierne, Marigrace Mccole, MD Performed: anesthesiologist   Preanesthetic Checklist Completed: patient identified, site marked, pre-op evaluation, timeout performed, IV checked, risks and benefits discussed and monitors and equipment checked  Epidural Patient position: sitting Prep: DuraPrep Patient monitoring: heart rate, cardiac monitor, continuous pulse ox and blood pressure Approach: midline Location: L2-L3 Injection technique: LOR saline  Needle:  Needle type: Tuohy  Needle gauge: 17 G Needle length: 9 cm Needle insertion depth: 5 cm Catheter type: closed end flexible Catheter size: 19 Gauge Catheter at skin depth: 11 cm Test dose: negative and Other  Assessment Events: blood not aspirated, injection not painful, no injection resistance and negative IV test  Additional Notes Informed consent obtained prior to proceeding including risk of failure, 1% risk of PDPH, risk of minor discomfort and bruising.  Discussed rare but serious complications including epidural abscess, permanent nerve injury, epidural hematoma.  Discussed alternatives to epidural analgesia and patient desires to proceed.  Timeout performed pre-procedure verifying patient name, procedure, and platelet count.  Patient tolerated procedure well. Reason for block:procedure for pain

## 2018-07-17 NOTE — MAU Provider Note (Signed)
S: Christy Moran is a 18 y.o. G1P0 at 64.4wks who presents for labor evaluation s/t contractions.  Nurse reports patient 4cm and requests ambulation.  Strip & Chart Reviewed.  O:  Vitals:   07/17/18 0852 07/17/18 0903  BP:  128/77  Pulse:  94  Resp:  18  Temp:  98.5 F (36.9 C)  Weight: 91.2 kg    No results found for this or any previous visit (from the past 24 hour(s)).  Dilation: 4.5 Effacement (%): 60 Cervical Position: Middle Station: -2 Presentation: Vertex Exam by:: K.Wilson,RN   NST: 6811-5726 FHR: 135 bpm, Mod Var, - Decels, + Accels UC: Q2-57min   A: IUP at 40.4wks  Cat I FT Labor Evaluation NST Reactive   P: Strip reviewed-Reactive Okay to ambulate and recheck after 60+min Nurse to place appropriate orders.  Follow Up (11:24 AM) Dilation: 5 Effacement (%): 70 Cervical Position: Middle Station: -2 Presentation: Vertex Exam by:: K.Wilson,RN  -Cervical change noted after ambulation. -Recommendation for admission for labor. -Nurse instructed to contact primary ob provider.  Sabas Sous, MSN, CNM 11:22 AM

## 2018-07-18 LAB — CBC
HEMATOCRIT: 28.4 % — AB (ref 36.0–46.0)
Hemoglobin: 9 g/dL — ABNORMAL LOW (ref 12.0–15.0)
MCH: 27.1 pg (ref 26.0–34.0)
MCHC: 31.7 g/dL (ref 30.0–36.0)
MCV: 85.5 fL (ref 80.0–100.0)
Platelets: 135 10*3/uL — ABNORMAL LOW (ref 150–400)
RBC: 3.32 MIL/uL — ABNORMAL LOW (ref 3.87–5.11)
RDW: 15.1 % (ref 11.5–15.5)
WBC: 8.5 10*3/uL (ref 4.0–10.5)
nRBC: 0 % (ref 0.0–0.2)

## 2018-07-18 LAB — RPR: RPR Ser Ql: NONREACTIVE

## 2018-07-18 MED ORDER — SENNOSIDES-DOCUSATE SODIUM 8.6-50 MG PO TABS
2.0000 | ORAL_TABLET | ORAL | Status: DC
  Administered 2018-07-18 (×2): 2 via ORAL
  Filled 2018-07-18 (×2): qty 2

## 2018-07-18 MED ORDER — DIPHENHYDRAMINE HCL 25 MG PO CAPS: 25.0000 mg | ORAL_CAPSULE | Freq: Four times a day (QID) | ORAL | Status: DC | PRN

## 2018-07-18 MED ORDER — IBUPROFEN 800 MG PO TABS
800.0000 mg | ORAL_TABLET | Freq: Three times a day (TID) | ORAL | Status: DC
  Administered 2018-07-18 – 2018-07-19 (×5): 800 mg via ORAL
  Filled 2018-07-18 (×5): qty 1

## 2018-07-18 MED ORDER — OXYCODONE-ACETAMINOPHEN 5-325 MG PO TABS
1.0000 | ORAL_TABLET | ORAL | Status: DC | PRN
  Administered 2018-07-18: 1 via ORAL
  Filled 2018-07-18: qty 1

## 2018-07-18 MED ORDER — SODIUM CHLORIDE 0.9% FLUSH: 3.0000 mL | Freq: Two times a day (BID) | INTRAVENOUS | Status: DC

## 2018-07-18 MED ORDER — ACETAMINOPHEN 325 MG PO TABS: 650.0000 mg | ORAL_TABLET | ORAL | Status: DC | PRN

## 2018-07-18 MED ORDER — METHYLERGONOVINE MALEATE 0.2 MG/ML IJ SOLN: 0.2000 mg | INTRAMUSCULAR | Status: DC | PRN

## 2018-07-18 MED ORDER — TETANUS-DIPHTH-ACELL PERTUSSIS 5-2.5-18.5 LF-MCG/0.5 IM SUSP: 0.5000 mL | Freq: Once | INTRAMUSCULAR | Status: DC

## 2018-07-18 MED ORDER — OXYCODONE-ACETAMINOPHEN 5-325 MG PO TABS: 2.0000 | ORAL_TABLET | ORAL | Status: DC | PRN

## 2018-07-18 MED ORDER — BENZOCAINE-MENTHOL 20-0.5 % EX AERO
1.0000 "application " | INHALATION_SPRAY | CUTANEOUS | Status: DC | PRN
  Filled 2018-07-18 (×2): qty 56

## 2018-07-18 MED ORDER — WITCH HAZEL-GLYCERIN EX PADS: 1.0000 "application " | MEDICATED_PAD | CUTANEOUS | Status: DC | PRN

## 2018-07-18 MED ORDER — ONDANSETRON HCL 4 MG PO TABS: 4.0000 mg | ORAL_TABLET | ORAL | Status: DC | PRN

## 2018-07-18 MED ORDER — SODIUM CHLORIDE 0.9% FLUSH: 3.0000 mL | INTRAVENOUS | Status: DC | PRN

## 2018-07-18 MED ORDER — PRENATAL MULTIVITAMIN CH
1.0000 | ORAL_TABLET | Freq: Every day | ORAL | Status: DC
  Administered 2018-07-18: 1 via ORAL
  Filled 2018-07-18: qty 1

## 2018-07-18 MED ORDER — SIMETHICONE 80 MG PO CHEW: 80.0000 mg | CHEWABLE_TABLET | ORAL | Status: DC | PRN

## 2018-07-18 MED ORDER — SODIUM CHLORIDE 0.9 % IV SOLN: 250.0000 mL | INTRAVENOUS | Status: DC | PRN

## 2018-07-18 MED ORDER — ZOLPIDEM TARTRATE 5 MG PO TABS: 5.0000 mg | ORAL_TABLET | Freq: Every evening | ORAL | Status: DC | PRN

## 2018-07-18 MED ORDER — DIBUCAINE 1 % RE OINT: 1.0000 "application " | TOPICAL_OINTMENT | RECTAL | Status: DC | PRN

## 2018-07-18 MED ORDER — COCONUT OIL OIL
1.0000 "application " | TOPICAL_OIL | Status: DC | PRN
  Administered 2018-07-19: 1 via TOPICAL
  Filled 2018-07-18: qty 120

## 2018-07-18 MED ORDER — MEASLES, MUMPS & RUBELLA VAC IJ SOLR
0.5000 mL | Freq: Once | INTRAMUSCULAR | Status: DC
  Filled 2018-07-18: qty 0.5

## 2018-07-18 MED ORDER — METHYLERGONOVINE MALEATE 0.2 MG PO TABS: 0.2000 mg | ORAL_TABLET | ORAL | Status: DC | PRN

## 2018-07-18 MED ORDER — ONDANSETRON HCL 4 MG/2ML IJ SOLN: 4.0000 mg | INTRAMUSCULAR | Status: DC | PRN

## 2018-07-18 MED ORDER — FERROUS SULFATE 325 (65 FE) MG PO TABS
325.0000 mg | ORAL_TABLET | Freq: Two times a day (BID) | ORAL | Status: DC
  Administered 2018-07-18 – 2018-07-19 (×3): 325 mg via ORAL
  Filled 2018-07-18 (×3): qty 1

## 2018-07-18 MED ORDER — MAGNESIUM HYDROXIDE 400 MG/5ML PO SUSP: 30.0000 mL | ORAL | Status: DC | PRN

## 2018-07-18 NOTE — Lactation Note (Signed)
This note was copied from a baby's chart. Lactation Consultation Note  Patient Name: Girl Leslie Dimeo [id number] Date: 07/18/2018 Reason for consult: Initial assessment;Term;Primapara;1st time breastfeeding(LC encouraged mom to call with feeding cues for [facility] assist -see LC note )  Baby is 15 hours old  Baby has been  to the breast for 10 mins x1 and since has had bottles up to - 35 ml.  Per mom  baby recently had 20 ml. Voids and stools QS for age.  LC recommended to mom to call on the nurses light for feeding cues so the [facility] or RN can assist With latch. Since the baby has had several bottles may need an appetizer of EBM or formula  And then latch.  LC recommended prior to latch - breast massage, hand express, pre-pump to prime the milk ducts  To enhance let down and to help baby get into a consistent feeding pattern.  LC instructed mom on the use of hand pump and cleaning . Family walked in and mom lunch .  LC enc to for mom to call .        Maternal Data Does the patient have breastfeeding experience prior to this delivery?: No  Feeding Feeding Type: (per mom just fed 20 ml of formula ) Nipple Type: Slow - flow  LATCH Score                   Interventions Interventions: Breast feeding basics reviewed;Hand pump  Lactation Tools Discussed/Used WIC Program: Yes Pump Review: Setup, frequency, and cleaning Initiated by:: MAI  Date initiated:: 07/18/18   Consult Status Consult Status: Follow-up Date: 07/18/18 Follow-up type: In-patient    Matilde Sprang Arianah Torgeson 07/18/2018, 2:32 PM

## 2018-07-18 NOTE — Progress Notes (Signed)
Post Partum Day 1 Subjective: no complaints, up ad lib, voiding and tolerating PO  Objective: Blood pressure 125/77, pulse 86, temperature 98.1 F (36.7 C), temperature source Oral, resp. rate 16, weight 91.2 kg, last menstrual period 10/06/2017, SpO2 100 %.  Physical Exam:  General: alert, cooperative and appears stated age Lochia: appropriate Uterine Fundus: firm DVT Evaluation: No evidence of DVT seen on physical exam.  Recent Labs    07/17/18 1153 07/18/18 0540  HGB 10.0* 9.0*  HCT 31.5* 28.4*    Assessment/Plan: Plan for discharge tomorrow  Breast & bottle   LOS: 1 day   Waynard Reeds 07/18/2018, 8:52 PM

## 2018-07-18 NOTE — Lactation Note (Signed)
This note was copied from a baby's chart. Lactation Consultation Note  Patient Name: Girl Shantisha Urben [id number] Date: 07/18/2018 Reason for consult: Follow-up assessment;1st time breastfeeding;Primapara;Term  24 hours FT female who is being mostly formula fed by her mother, she's a P1, baby is on [employer]. RN Fannie Knee had LC called out for assistance, when [facility] went to mom's room she voiced she had already fed baby some formula but, that she had tried latched her on earlier tonight around 10 pm but that the feedings at the breast are "not working" for her.  LC explained to mom that if she doesn't feel comfortable putting baby to the breast, she can also pump and bottle and feed baby her EBM in a bottle just like she does it with the formula. Mom asked LC to show her how to use her pump, reviewed pump instructions again, mom voiced understanding.  Unsure if mom is really committed to BF at this point, but she was encouraged to call for assistance when she's ready to put baby back to the breast, or if she needs any further instructions with her hand pump. Mom reported all questions and concerns were answered, she's aware of LC services and will call PRN.    Maternal Data    Feeding Feeding Type: Bottle Fed - Formula    Interventions Interventions: Breast feeding basics reviewed;Hand pump  Lactation Tools Discussed/Used Tools: Pump Breast pump type: Manual   Consult Status Consult Status: PRN Follow-up type: In-patient    Jasiah Buntin Venetia Constable 07/18/2018, 11:20 PM

## 2018-07-18 NOTE — Anesthesia Postprocedure Evaluation (Signed)
Anesthesia Post Note  Patient: Christy GentaBrandyn A Moran  Procedure(s) Performed: AN AD HOC LABOR EPIDURAL     Patient location during evaluation: Mother Baby Anesthesia Type: Epidural Level of consciousness: awake and alert and oriented Pain management: satisfactory to patient Vital Signs Assessment: post-procedure vital signs reviewed and stable Respiratory status: respiratory function stable Cardiovascular status: stable Postop Assessment: no headache, no backache, epidural receding, patient able to bend at knees, no signs of nausea or vomiting and adequate PO intake Anesthetic complications: no    Last Vitals:  Vitals:   07/18/18 1045 07/18/18 1500  BP: (!) 110/59 125/77  Pulse: 83 86  Resp: 18 16  Temp: 36.7 C 36.7 C  SpO2: 98% 100%    Last Pain:  Vitals:   07/18/18 1500  TempSrc: Oral  PainSc: 2    Pain Goal:                   Polk Minor

## 2018-07-19 ENCOUNTER — Encounter (HOSPITAL_COMMUNITY): Payer: Self-pay | Admitting: *Deleted

## 2018-07-19 MED ORDER — IBUPROFEN 600 MG PO TABS: 600.0000 mg | ORAL_TABLET | Freq: Four times a day (QID) | ORAL | 0 refills | Status: DC | PRN

## 2018-07-19 MED ORDER — DOCUSATE SODIUM 100 MG PO CAPS: 100.0000 mg | ORAL_CAPSULE | Freq: Two times a day (BID) | ORAL | 0 refills | Status: DC

## 2018-07-19 NOTE — Discharge Instructions (Signed)

## 2018-07-19 NOTE — Discharge Summary (Signed)
Obstetric Discharge Summary Reason for Admission: onset of labor Prenatal Procedures: NST and ultrasound Intrapartum Procedures: spontaneous vaginal delivery Postpartum Procedures: none Complications-Operative and Postpartum: 1st degree perineal laceration Hemoglobin  Date Value Ref Range Status  07/18/2018 9.0 (L) 12.0 - 15.0 g/dL Final   HCT  Date Value Ref Range Status  07/18/2018 28.4 (L) 36.0 - 46.0 % Final    Physical Exam:  General: alert, cooperative and appears stated age 18: appropriate Uterine Fundus: firm Incision: healing well DVT Evaluation: No evidence of DVT seen on physical exam.  Discharge Diagnoses: Term Pregnancy-delivered  Discharge Information: Date: 07/19/2018 Activity: pelvic rest Diet: routine Medications: Ibuprofen and Colace Condition: improved Instructions: refer to practice specific booklet Discharge to: home   Newborn Data: Live born female  Birth Weight: 8 lb 1.5 oz (3670 g) APGAR: 9, 10  Newborn Delivery   Birth date/time:  07/17/2018 22:51:00 Delivery type:  Vaginal, Spontaneous     Home with mother.  Waynard Reeds 07/19/2018, 7:42 AM

## 2018-07-19 NOTE — Progress Notes (Signed)
CSW received consult for hx of marijuana use.  Referral was screened out due to the following: ~MOB had no documented substance use after initial prenatal visit/+UPT. ~MOB had no positive drug screens after initial prenatal visit/+UPT. ~Baby's UDS is negative.  Please consult CSW if current concerns arise or by MOB's request.  CSW will monitor CDS results and make report to Child Protective Services if warranted.  Celso Sickle, LCSWA Clinical Social Worker [facility] Cell#: [phone]

## 2018-07-19 NOTE — Lactation Note (Signed)
This note was copied from a baby's chart. Lactation Consultation Note  Patient Name: Christy Moran Date: 07/19/2018   Mom reports that pumping hurts. She has used the size 24 & size 27 flanges. Mom appears to be a size 24 flange. The size 24 flange was lightly lubricated with coconut oil & Mom was shown how to adjust the suction on the hand pump. Mom felt more comfortable & the size 24 flange does seem to be the correct fit at this time.   How to wash/sanitize pump parts was discussed with Mom. Mom has Dr. Theora Gianotti bottles with Level 1 nipples at home. Mom understands that she can switch to the Dr. Theora Gianotti Newborn nipple if she feels like infant is drinking too quickly. Pacing bottle feeds also discussed.    Lurline Hare [facility] 07/19/2018, 10:28 AM

## 2018-07-21 ENCOUNTER — Inpatient Hospital Stay (HOSPITAL_COMMUNITY): Admission: RE | Admit: 2018-07-21 | Payer: Medicaid Other | Source: Ambulatory Visit

## 2018-11-27 IMAGING — US US OB < 14 WEEKS - US OB TV
1 series · 15 of 28 positions shown · non-contrast
Comparison: None.

CLINICAL DATA: Initial evaluation for, which measures.

EXAM:
OBSTETRIC <14 WK US AND TRANSVAGINAL OB US
TECHNIQUE: Both transabdominal and transvaginal ultrasound examinations were
performed for complete evaluation of the gestation as well as the
maternal uterus, adnexal regions, and pelvic cul-de-sac.
Transvaginal technique was performed to assess early pregnancy.

[Series 1: us ob < 14 weeks - us ob tv · 15 of 65 slices shown]
[im 1/65]
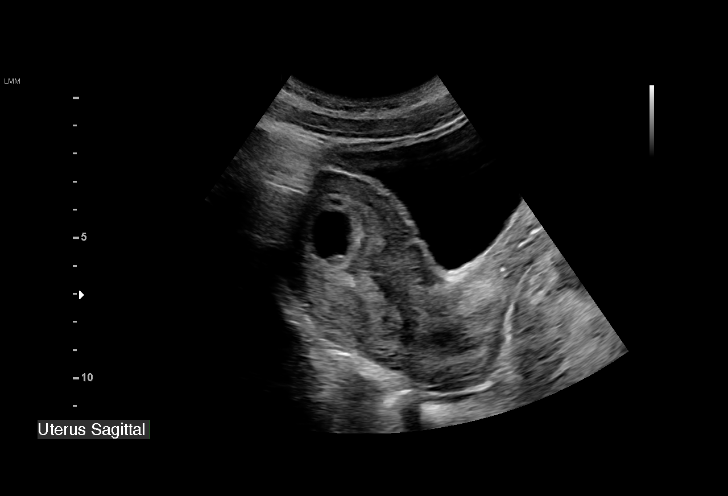
[im 5/65]
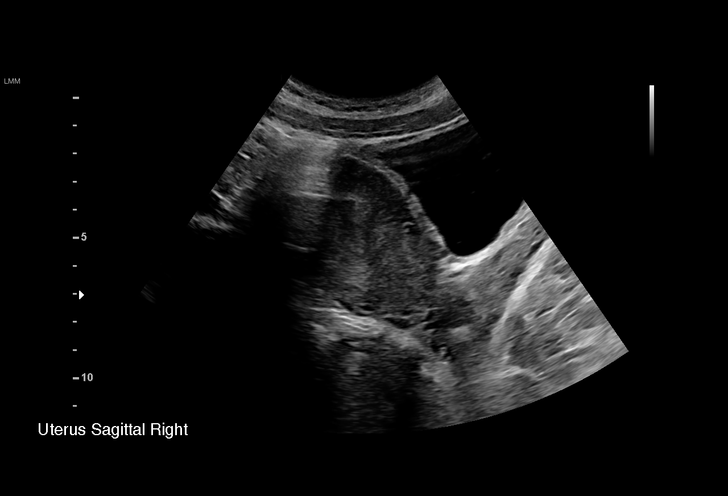
[im 10/65]
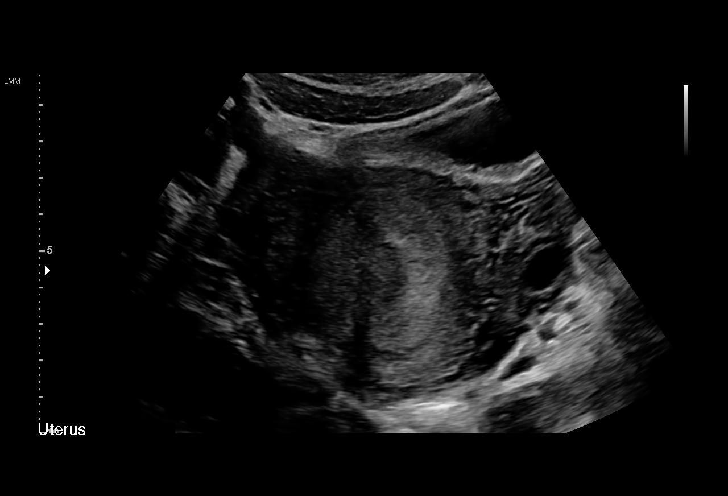
[im 15/65]
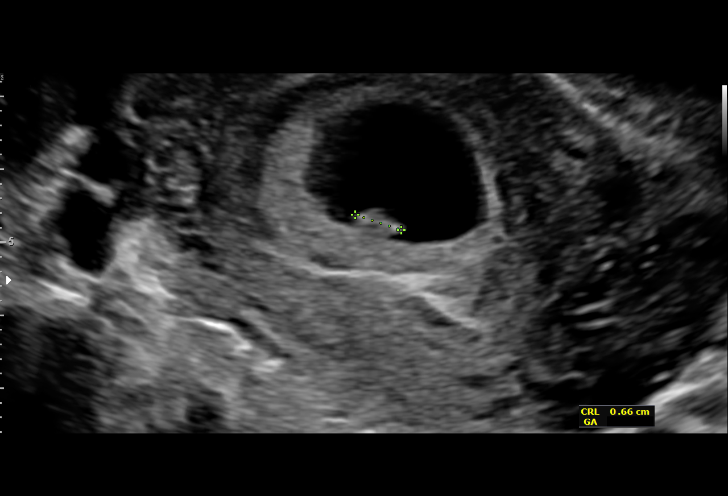
[im 19/65]
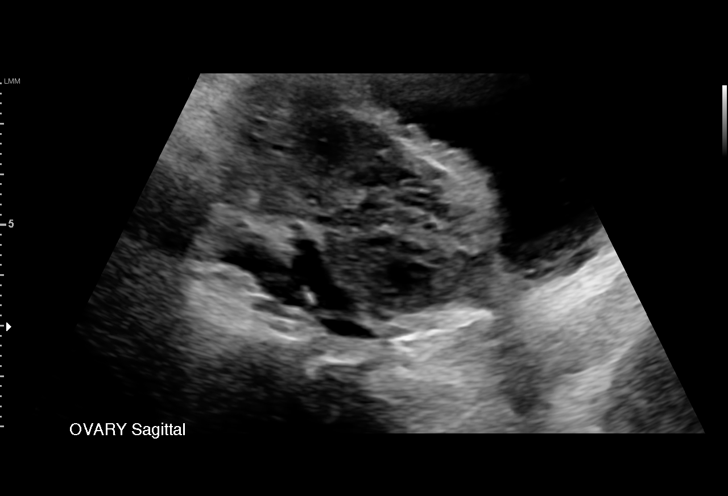
[im 24/65]
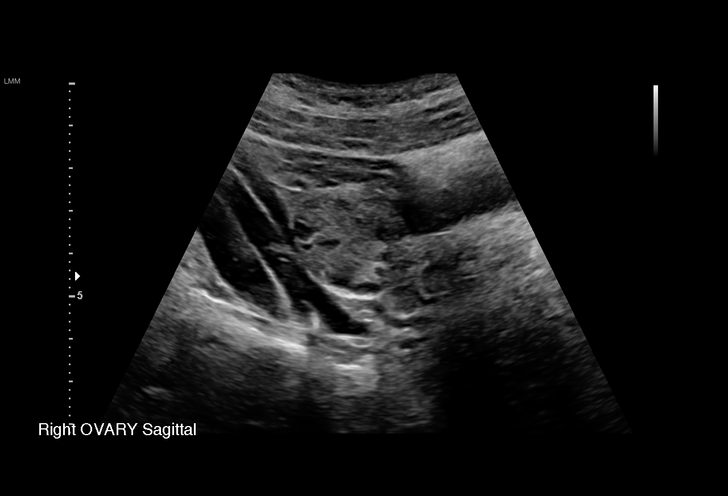
[im 29/65]
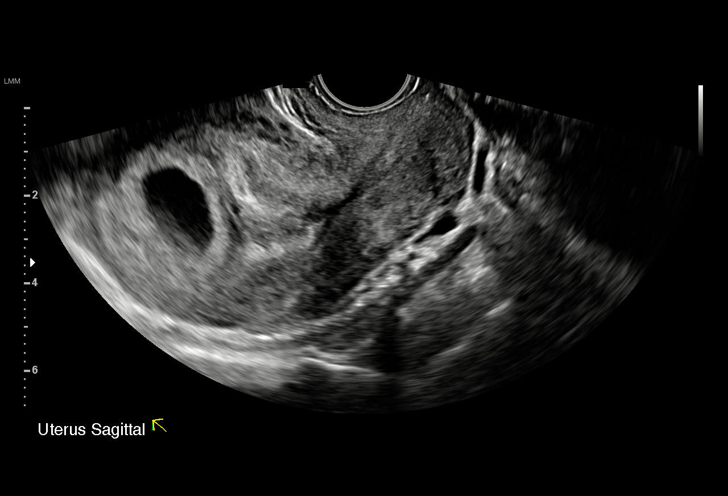
[im 34/65]
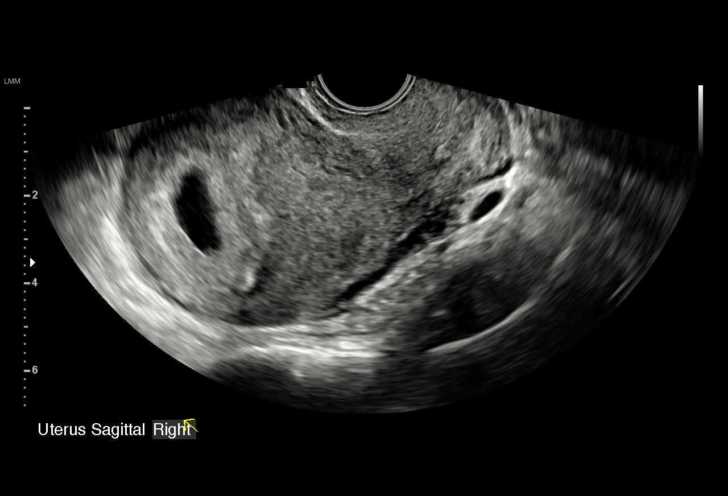
[im 36/65]
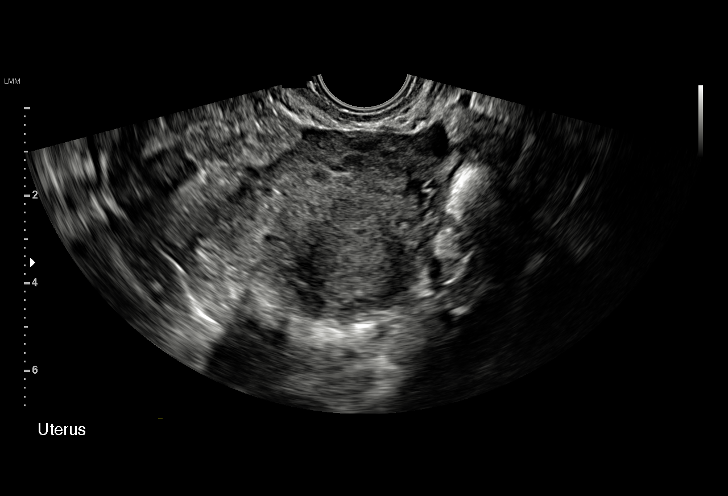
[im 41/65]
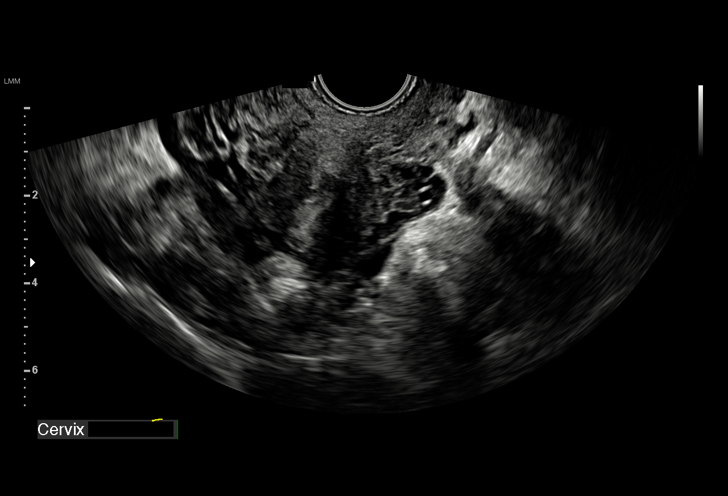
[im 46/65]
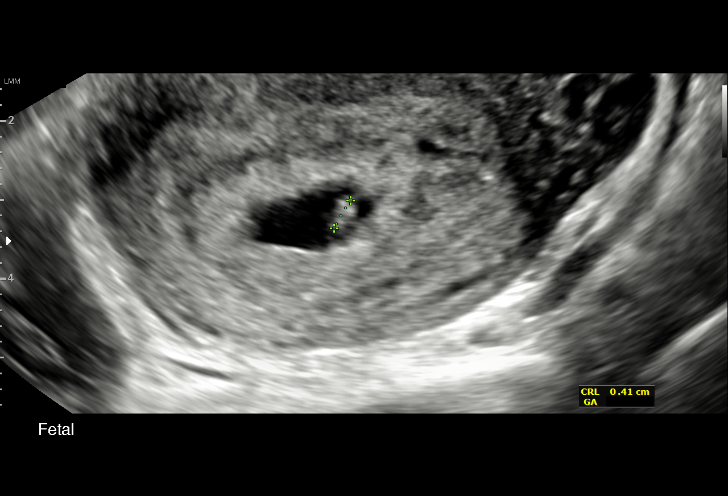
[im 50/65]
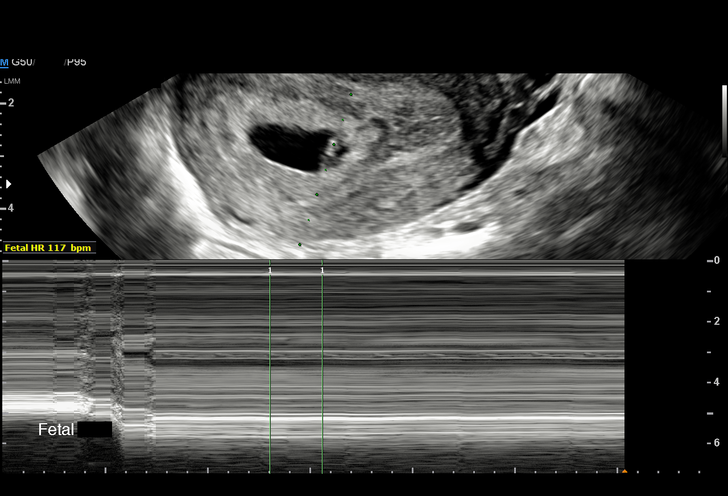
[im 55/65]
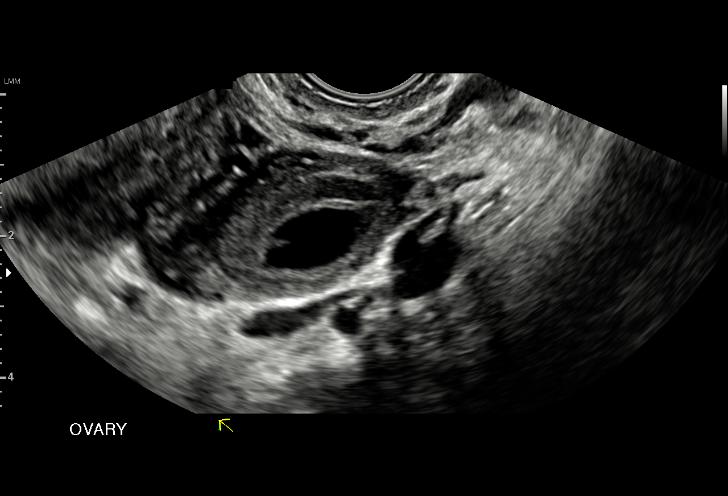
[im 60/65]
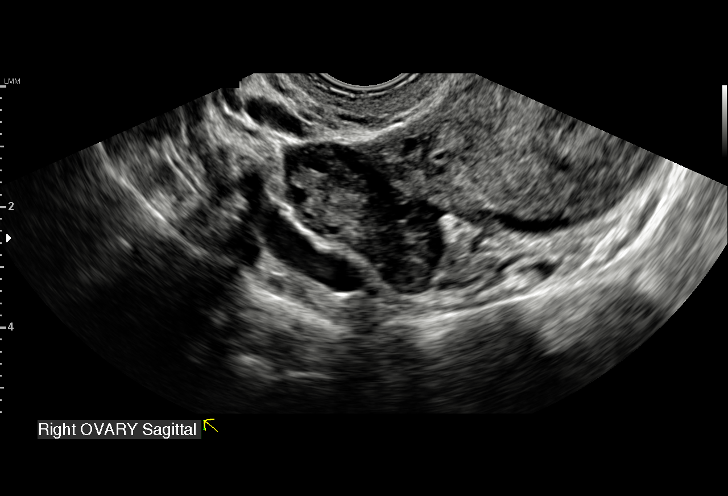
[im 65/65]
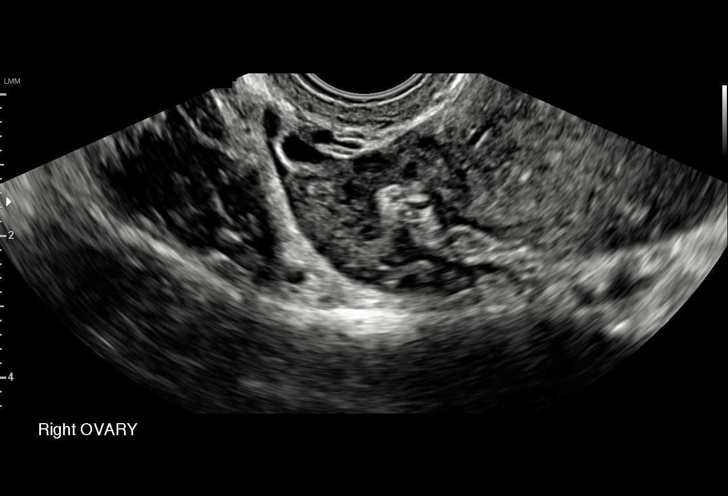

[15 of 28 positions shown; findings below may reference images not displayed]

FINDINGS: Intrauterine gestational sac: Single

Yolk sac:  Present

Embryo:  Present

Cardiac Activity: Present

Heart Rate: 119 bpm

CRL: 4.6 mm   6 w   1 d                  US EDC: 07/12/2018

Subchorionic hemorrhage:  None visualized.

Maternal uterus/adnexae: Ovaries are normal in appearance
bilaterally. No adnexal mass. No free fluid.
IMPRESSION: 1. Single viable intrauterine pregnancy without complication,
estimated gestational age 6 weeks and 1 day by crown-rump length.
2. No other acute maternal uterine or adnexal abnormality
identified.

## 2019-02-24 ENCOUNTER — Other Ambulatory Visit: Payer: Self-pay | Admitting: Emergency Medicine

## 2019-02-24 ENCOUNTER — Other Ambulatory Visit: Payer: Self-pay

## 2019-02-24 ENCOUNTER — Encounter (HOSPITAL_BASED_OUTPATIENT_CLINIC_OR_DEPARTMENT_OTHER): Payer: Self-pay

## 2019-02-24 ENCOUNTER — Emergency Department (HOSPITAL_BASED_OUTPATIENT_CLINIC_OR_DEPARTMENT_OTHER)
Admission: EM | Admit: 2019-02-24 | Discharge: 2019-02-24 | Disposition: A | Discharge status: Home / Self Care | Payer: Managed Care, Other (non HMO) | Attending: Emergency Medicine | Admitting: Emergency Medicine

## 2019-02-24 DIAGNOSIS — Z3202 Encounter for pregnancy test, result negative: Secondary | ICD-10-CM | POA: Insufficient documentation

## 2019-02-24 DIAGNOSIS — Z79899 Other long term (current) drug therapy: Secondary | ICD-10-CM | POA: Diagnosis not present

## 2019-02-24 DIAGNOSIS — N739 Female pelvic inflammatory disease, unspecified: Secondary | ICD-10-CM | POA: Diagnosis not present

## 2019-02-24 DIAGNOSIS — N898 Other specified noninflammatory disorders of vagina: Secondary | ICD-10-CM | POA: Diagnosis present

## 2019-02-24 LAB — URINALYSIS, ROUTINE W REFLEX MICROSCOPIC
Bilirubin Urine: NEGATIVE
Glucose, UA: NEGATIVE mg/dL
Hgb urine dipstick: NEGATIVE
Ketones, ur: NEGATIVE mg/dL
Nitrite: NEGATIVE
Protein, ur: NEGATIVE mg/dL
Specific Gravity, Urine: 1.025 (ref 1.005–1.030)
pH: 6.5 (ref 5.0–8.0)

## 2019-02-24 LAB — URINALYSIS, MICROSCOPIC (REFLEX)

## 2019-02-24 LAB — WET PREP, GENITAL
Sperm: NONE SEEN
Trich, Wet Prep: NONE SEEN
Yeast Wet Prep HPF POC: NONE SEEN

## 2019-02-24 LAB — PREGNANCY, URINE: Preg Test, Ur: NEGATIVE

## 2019-02-24 MED ORDER — DOXYCYCLINE HYCLATE 100 MG PO TABS
100.0000 mg | ORAL_TABLET | Freq: Once | ORAL | Status: AC
  Administered 2019-02-24: 14:00:00 100 mg via ORAL
  Filled 2019-02-24: qty 1

## 2019-02-24 MED ORDER — NAPROXEN 375 MG PO TABS: 375.0000 mg | ORAL_TABLET | Freq: Two times a day (BID) | ORAL | 0 refills | Status: DC

## 2019-02-24 MED ORDER — DOXYCYCLINE HYCLATE 100 MG PO CAPS: 100.0000 mg | ORAL_CAPSULE | Freq: Two times a day (BID) | ORAL | 0 refills | Status: AC

## 2019-02-24 MED ORDER — METRONIDAZOLE 500 MG PO TABS
500.0000 mg | ORAL_TABLET | Freq: Once | ORAL | Status: AC
  Administered 2019-02-24: 14:00:00 500 mg via ORAL
  Filled 2019-02-24: qty 1

## 2019-02-24 MED ORDER — IBUPROFEN 400 MG PO TABS
600.0000 mg | ORAL_TABLET | Freq: Once | ORAL | Status: AC
  Administered 2019-02-24: 600 mg via ORAL
  Filled 2019-02-24: qty 1

## 2019-02-24 MED ORDER — CEFTRIAXONE SODIUM 250 MG IJ SOLR
250.0000 mg | Freq: Once | INTRAMUSCULAR | Status: AC
  Administered 2019-02-24: 14:00:00 250 mg via INTRAMUSCULAR
  Filled 2019-02-24: qty 250

## 2019-02-24 MED ORDER — METRONIDAZOLE 500 MG PO TABS: 500.0000 mg | ORAL_TABLET | Freq: Two times a day (BID) | ORAL | 0 refills | Status: DC

## 2019-02-24 NOTE — Discharge Instructions (Signed)
If you develop worsening, continued, or recurrent abdominal pain, uncontrolled vomiting, fever, chest or back pain, or any other new/concerning symptoms then return to the ER for evaluation.   You are being prescribed naproxen.  Do not take other NSAIDs such as ibuprofen, Motrin, Aleve, Naprosyn, Mobic, meloxicam, Advil, etc.

## 2019-02-24 NOTE — ED Provider Notes (Signed)
[facility] Provider Note   CSN: [id number] Arrival date & time: 02/24/19  1142     History   Chief Complaint Chief Complaint  Patient presents with  . Vaginal Discharge    HPI Christy Moran is a 18 y.o. female.     HPI  18 year old female presents with concern for STI.  Vaginal discharge and foul odor for a couple weeks.  Had intercourse about 4 days ago and states it was quite painful.  Has been having some lower abdominal pain.  No fever, nausea, vomiting.  A little bit of light dysuria and frequency.  She has had gonorrhea and chlamydia before.  She had an abortion in August and has not restarted her menstrual cycle.  Discharge is green.  Past Medical History:  Diagnosis Date  . Anemia   . Hx of gonorrhea     Patient Active Problem List   Diagnosis Date Noted  . Normal labor 07/17/2018  . Encounter for supervision of normal first pregnancy in first trimester 02/14/2018    Past Surgical History:  Procedure Laterality Date  . INDUCED ABORTION    . NO PAST SURGERIES       OB History    Gravida  1   Para      Term      Preterm      AB      Living        SAB      TAB      Ectopic      Multiple      Live Births               Home Medications    Prior to Admission medications   Medication Sig Start Date End Date Taking? Authorizing Provider  docusate sodium (COLACE) 100 MG capsule Take 1 capsule (100 mg total) by mouth 2 (two) times daily. 07/19/18   Vanessa Kick, MD  doxycycline (VIBRAMYCIN) 100 MG capsule Take 1 capsule (100 mg total) by mouth 2 (two) times daily for 14 days. 02/24/19 03/10/19  Sherwood Gambler, MD  ferrous sulfate 325 (65 FE) MG tablet Take 325 mg by mouth daily with breakfast.    [provider]  ibuprofen (ADVIL,MOTRIN) 600 MG tablet Take 1 tablet (600 mg total) by mouth every 6 (six) hours as needed. 07/19/18   Vanessa Kick, MD  metroNIDAZOLE (FLAGYL) 500 MG tablet Take 1 tablet (500  mg total) by mouth 2 (two) times daily. 02/24/19   Sherwood Gambler, MD  naproxen (NAPROSYN) 375 MG tablet Take 1 tablet (375 mg total) by mouth 2 (two) times daily. 02/24/19   Sherwood Gambler, MD  Prenatal Vit-Fe Fumarate-FA (PRENATAL MULTIVITAMIN) TABS tablet Take 1 tablet by mouth daily at 12 noon.    [provider]    Family History Family History  Problem Relation Age of Onset  . Hypertension Mother   . Hypertension Father   . Stroke Maternal Uncle   . Cancer Maternal Grandfather        prostate  . Cancer Paternal Grandfather   . Drug abuse Neg Hx     Social History Social History   Tobacco Use  . Smoking status: Never Smoker  . Smokeless tobacco: Never Used  Substance Use Topics  . Alcohol use: Yes    Comment: occ  . Drug use: Never     Allergies   Patient has no known allergies.   Review of Systems Review of Systems  Constitutional:  Negative for fever.  Gastrointestinal: Positive for abdominal pain. Negative for vomiting.  Genitourinary: Positive for dyspareunia, dysuria and vaginal discharge. Negative for vaginal bleeding.  All other systems reviewed and are negative.    Physical Exam Updated Vital Signs BP 102/85 (BP Location: Left Arm)   Pulse 73   Temp 98.8 F (37.1 C) (Oral)   Resp 16   Ht 5\' 4"  (1.626 m)   Wt 72.1 kg   LMP  (LMP Unknown)   SpO2 100%   BMI 27.29 kg/m   Physical Exam Vitals signs and nursing note reviewed. Exam conducted with a chaperone present.  Constitutional:      Appearance: She is well-developed.  HENT:     Head: Normocephalic and atraumatic.     Right Ear: External ear normal.     Left Ear: External ear normal.     Nose: Nose normal.  Eyes:     General:        Right eye: No discharge.        Left eye: No discharge.  Cardiovascular:     Rate and Rhythm: Normal rate and regular rhythm.     Heart sounds: Normal heart sounds.  Pulmonary:     Effort: Pulmonary effort is normal.     Breath sounds: Normal  breath sounds.  Abdominal:     Palpations: Abdomen is soft.     Tenderness: There is no abdominal tenderness.  Genitourinary:    Vagina: Vaginal discharge present.     Cervix: Cervical motion tenderness and discharge present. No cervical bleeding.     Uterus: Tender (mild).   Skin:    General: Skin is warm and dry.  Neurological:     Mental Status: She is alert.  Psychiatric:        Mood and Affect: Mood is not anxious.      ED Treatments / Results  Labs (all labs ordered are listed, but only abnormal results are displayed) Labs Reviewed  WET PREP, GENITAL - Abnormal; Notable for the following components:      Result Value   Clue Cells Wet Prep HPF POC PRESENT (*)    WBC, Wet Prep HPF POC MANY (*)    All other components within normal limits  URINALYSIS, ROUTINE W REFLEX MICROSCOPIC - Abnormal; Notable for the following components:   Leukocytes,Ua MODERATE (*)    All other components within normal limits  URINALYSIS, MICROSCOPIC (REFLEX) - Abnormal; Notable for the following components:   Bacteria, UA FEW (*)    All other components within normal limits  PREGNANCY, URINE  RPR  HIV ANTIBODY (ROUTINE TESTING W REFLEX)  GC/CHLAMYDIA PROBE AMP ([place]) NOT AT [facility]    EKG None  Radiology No results found.  Procedures Procedures (including critical care time)  Medications Ordered in ED Medications  ibuprofen (ADVIL) tablet 600 mg (600 mg Oral Given 02/24/19 1256)  cefTRIAXone (ROCEPHIN) injection 250 mg (250 mg Intramuscular Given 02/24/19 1345)  doxycycline (VIBRA-TABS) tablet 100 mg (100 mg Oral Given 02/24/19 1344)  metroNIDAZOLE (FLAGYL) tablet 500 mg (500 mg Oral Given 02/24/19 1344)     Initial Impression / Assessment and Plan / ED Course  I have reviewed the triage vital signs and the nursing notes.  Pertinent labs & imaging results that were available during my care of the patient were reviewed by me and considered in my medical decision making (see  chart for details).        Presentation/exam is consistent with PID.  She  will be given IM Rocephin as well as 2 weeks of doxycycline.  Flagyl for the clue cells.  The abdominal tenderness on exam is pretty minimal.  I do not think any imaging is needed and I have low suspicion for tubo-ovarian abscess or other acute complication.  Discharged home with return precautions.  Final Clinical Impressions(s) / ED Diagnoses   Final diagnoses:  Pelvic inflammatory disease (PID)    ED Discharge Orders         Ordered    doxycycline (VIBRAMYCIN) 100 MG capsule  2 times daily     02/24/19 1402    metroNIDAZOLE (FLAGYL) 500 MG tablet  2 times daily     02/24/19 1402    naproxen (NAPROSYN) 375 MG tablet  2 times daily     02/24/19 1402           Pricilla LovelessGoldston, Vanya Carberry, MD 02/24/19 1435

## 2019-02-24 NOTE — ED Triage Notes (Signed)
Pt c/o vaginal d/c x 2-3 weeks-NAD-steady gait

## 2019-02-24 NOTE — ED Notes (Signed)
Pt verbalized understanding of dc instructions.

## 2019-02-25 LAB — HIV ANTIBODY (ROUTINE TESTING W REFLEX): HIV Screen 4th Generation wRfx: NONREACTIVE

## 2019-02-25 LAB — CERVICOVAGINAL ANCILLARY ONLY
Chlamydia: NEGATIVE
Neisseria Gonorrhea: NEGATIVE

## 2019-02-25 LAB — RPR: RPR Ser Ql: NONREACTIVE

## 2019-06-29 ENCOUNTER — Ambulatory Visit: Payer: Managed Care, Other (non HMO) | Attending: Internal Medicine

## 2019-06-29 DIAGNOSIS — Z20822 Contact with and (suspected) exposure to covid-19: Secondary | ICD-10-CM

## 2019-06-30 LAB — NOVEL CORONAVIRUS, NAA: SARS-CoV-2, NAA: NOT DETECTED

## 2019-07-02 ENCOUNTER — Emergency Department (HOSPITAL_COMMUNITY)
Admission: EM | Admit: 2019-07-02 | Discharge: 2019-07-03 | Discharge status: Against Medical Advice | Payer: Managed Care, Other (non HMO) | Attending: Emergency Medicine | Admitting: Emergency Medicine

## 2019-07-02 ENCOUNTER — Encounter (HOSPITAL_COMMUNITY): Payer: Self-pay | Admitting: Emergency Medicine

## 2019-07-02 ENCOUNTER — Other Ambulatory Visit: Payer: Self-pay

## 2019-07-02 DIAGNOSIS — Z5321 Procedure and treatment not carried out due to patient leaving prior to being seen by health care provider: Secondary | ICD-10-CM | POA: Diagnosis not present

## 2019-07-02 DIAGNOSIS — R509 Fever, unspecified: Secondary | ICD-10-CM | POA: Diagnosis not present

## 2019-07-02 LAB — BASIC METABOLIC PANEL
Anion gap: 11 (ref 5–15)
BUN: 9 mg/dL (ref 6–20)
CO2: 24 mmol/L (ref 22–32)
Calcium: 9 mg/dL (ref 8.9–10.3)
Chloride: 104 mmol/L (ref 98–111)
Creatinine, Ser: 0.92 mg/dL (ref 0.44–1.00)
GFR calc Af Amer: >60 mL/min (ref >60)
GFR calc non Af Amer: >60 mL/min (ref >60)
Glucose, Bld: 88 mg/dL (ref 70–99)
Potassium: 3.5 mmol/L (ref 3.5–5.1)
Sodium: 139 mmol/L (ref 135–145)

## 2019-07-02 LAB — CBC WITH DIFFERENTIAL/PLATELET
Abs Immature Granulocytes: 0.03 10*3/uL (ref 0.00–0.07)
Basophils Absolute: 0 10*3/uL (ref 0.0–0.1)
Basophils Relative: 0 %
Eosinophils Absolute: 0 10*3/uL (ref 0.0–0.5)
Eosinophils Relative: 0 %
HCT: 37.7 % (ref 36.0–46.0)
Hemoglobin: 12.3 g/dL (ref 12.0–15.0)
Immature Granulocytes: 0 %
Lymphocytes Relative: 26 %
Lymphs Abs: 2.6 10*3/uL (ref 0.7–4.0)
MCH: 28.5 pg (ref 26.0–34.0)
MCHC: 32.6 g/dL (ref 30.0–36.0)
MCV: 87.3 fL (ref 80.0–100.0)
Monocytes Absolute: 0.7 10*3/uL (ref 0.1–1.0)
Monocytes Relative: 7 %
Neutro Abs: 6.7 10*3/uL (ref 1.7–7.7)
Neutrophils Relative %: 67 %
Platelets: 175 10*3/uL (ref 150–400)
RBC: 4.32 MIL/uL (ref 3.87–5.11)
RDW: 14.5 % (ref 11.5–15.5)
WBC: 10 10*3/uL (ref 4.0–10.5)
nRBC: 0 % (ref 0.0–0.2)

## 2019-07-02 LAB — URINALYSIS, ROUTINE W REFLEX MICROSCOPIC
Bilirubin Urine: NEGATIVE
Glucose, UA: NEGATIVE mg/dL
Hgb urine dipstick: NEGATIVE
Ketones, ur: NEGATIVE mg/dL
Nitrite: NEGATIVE
Protein, ur: NEGATIVE mg/dL
Specific Gravity, Urine: 1.004 — ABNORMAL LOW (ref 1.005–1.030)
pH: 7 (ref 5.0–8.0)

## 2019-07-02 LAB — I-STAT BETA HCG BLOOD, ED (MC, WL, AP ONLY): I-stat hCG, quantitative: <5 m[IU]/mL (ref <5)

## 2019-07-02 NOTE — ED Triage Notes (Signed)
Patient reports fever , chills , fatigue , gen. body aches , nausea and sore throat onset today .

## 2019-07-03 NOTE — ED Notes (Signed)
Patient states "I cant wait I have to work in the morning." pt LWBS

## 2019-10-01 ENCOUNTER — Encounter (HOSPITAL_COMMUNITY): Payer: Self-pay

## 2019-10-01 ENCOUNTER — Other Ambulatory Visit: Payer: Self-pay

## 2019-10-01 ENCOUNTER — Ambulatory Visit (HOSPITAL_COMMUNITY)
Admission: EM | Admit: 2019-10-01 | Discharge: 2019-10-01 | Disposition: A | Discharge status: Home / Self Care | Payer: Managed Care, Other (non HMO) | Attending: Family Medicine | Admitting: Family Medicine

## 2019-10-01 DIAGNOSIS — F419 Anxiety disorder, unspecified: Secondary | ICD-10-CM | POA: Diagnosis not present

## 2019-10-01 DIAGNOSIS — F410 Panic disorder [episodic paroxysmal anxiety] without agoraphobia: Secondary | ICD-10-CM

## 2019-10-01 MED ORDER — SERTRALINE HCL 25 MG PO TABS: 25.0000 mg | ORAL_TABLET | Freq: Every day | ORAL | 0 refills | Status: DC

## 2019-10-01 MED ORDER — LORAZEPAM 1 MG PO TABS: 0.5000 mg | ORAL_TABLET | Freq: Three times a day (TID) | ORAL | 0 refills | Status: DC | PRN

## 2019-10-01 NOTE — Discharge Instructions (Addendum)
Establish primary care with [facility] urgent care.  Start Zoloft tonight.  You may take half tablet of Ativan as needed for panic attacks and days "thought tornadoes."  Follow up with [facility] or [facility] to get set up with counseling.   Follow up with primary care within the next month. I'd like you to be followed by the same practice for medication maintenance.   Genesight testing. Google and search for provider in your area if you have tried and failed 3 medications.

## 2019-10-01 NOTE — ED Provider Notes (Signed)
[place]    CSN: [id number] Arrival date & time: 10/01/19  4097      History   Chief Complaint Chief Complaint  Patient presents with  . Anxiety    HPI Christy Moran is a 19 y.o. female.   Patient reports that over the past few months she has had lots of stressful life, and that she is feeling overwhelmed and anxious often.  Reports that she feels like she needs to hide away whenever she is feeling overwhelmed.  Patient is single mother to a 68-year-old child.  Patient is living with her sister at the moment.  Patient has had issues with the child's father.  Denies taking any medication for this denies seeing therapy or counseling for this as well.  Expresses interest in both.  No significant medical history.  Denies headache, cough, shortness of breath, nausea, vomiting, diarrhea, rash, fever, other symptoms.  ROS per HPI  The history is provided by the patient.    Past Medical History:  Diagnosis Date  . Anemia   . Hx of gonorrhea     Patient Active Problem List   Diagnosis Date Noted  . Normal labor 07/17/2018  . Encounter for supervision of normal first pregnancy in first trimester 02/14/2018    Past Surgical History:  Procedure Laterality Date  . INDUCED ABORTION    . NO PAST SURGERIES      OB History    Gravida  1   Para      Term      Preterm      AB      Living        SAB      TAB      Ectopic      Multiple      Live Births               Home Medications    Prior to Admission medications   Medication Sig Start Date End Date Taking? Authorizing Provider  docusate sodium (COLACE) 100 MG capsule Take 1 capsule (100 mg total) by mouth 2 (two) times daily. 07/19/18   Vanessa Kick, MD  ferrous sulfate 325 (65 FE) MG tablet Take 325 mg by mouth daily with breakfast.    [provider]  ibuprofen (ADVIL,MOTRIN) 600 MG tablet Take 1 tablet (600 mg total) by mouth every 6 (six) hours as needed. 07/19/18   Vanessa Kick,  MD  LORazepam (ATIVAN) 1 MG tablet Take 0.5 tablets (0.5 mg total) by mouth 3 (three) times daily as needed for anxiety. 10/01/19   Faustino Congress, NP  metroNIDAZOLE (FLAGYL) 500 MG tablet Take 1 tablet (500 mg total) by mouth 2 (two) times daily. 02/24/19   Sherwood Gambler, MD  naproxen (NAPROSYN) 375 MG tablet Take 1 tablet (375 mg total) by mouth 2 (two) times daily. 02/24/19   Sherwood Gambler, MD  Prenatal Vit-Fe Fumarate-FA (PRENATAL MULTIVITAMIN) TABS tablet Take 1 tablet by mouth daily at 12 noon.    [provider]  sertraline (ZOLOFT) 25 MG tablet Take 1 tablet (25 mg total) by mouth daily. 10/01/19   Faustino Congress, NP    Family History Family History  Problem Relation Age of Onset  . Hypertension Mother   . Hypertension Father   . Stroke Maternal Uncle   . Cancer Maternal Grandfather        prostate  . Cancer Paternal Grandfather   . Drug abuse Neg Hx     Social History Social  History   Tobacco Use  . Smoking status: Never Smoker  . Smokeless tobacco: Never Used  Substance Use Topics  . Alcohol use: Yes    Comment: occ  . Drug use: Never     Allergies   Patient has no known allergies.   Review of Systems Review of Systems   Physical Exam Triage Vital Signs ED Triage Vitals [10/01/19 0846]  Enc Vitals Group     BP 116/70     Pulse Rate 74     Resp 18     Temp 98 F (36.7 C)     Temp Source Oral     SpO2      Weight 150 lb (68 kg)     Height 5\' 4"  (1.626 m)     Head Circumference      Peak Flow      Pain Score 0     Pain Loc      Pain Edu?      Excl. in GC?    No data found.  Updated Vital Signs BP 116/70   Pulse 74   Temp 98 F (36.7 C) (Oral)   Resp 18   Ht 5\' 4"  (1.626 m)   Wt 150 lb (68 kg)   BMI 25.75 kg/m   Visual Acuity Right Eye Distance:   Left Eye Distance:   Bilateral Distance:    Right Eye Near:   Left Eye Near:    Bilateral Near:     Physical Exam Vitals and nursing note reviewed.    Constitutional:      General: She is not in acute distress.    Appearance: Normal appearance. She is well-developed and normal weight. She is not ill-appearing.  HENT:     Head: Normocephalic and atraumatic.     Right Ear: Tympanic membrane normal.     Left Ear: Tympanic membrane normal.     Nose: Nose normal.     Mouth/Throat:     Mouth: Mucous membranes are moist.     Pharynx: Oropharynx is clear.  Eyes:     Extraocular Movements: Extraocular movements intact.     Conjunctiva/sclera: Conjunctivae normal.     Pupils: Pupils are equal, round, and reactive to light.  Cardiovascular:     Rate and Rhythm: Normal rate and regular rhythm.     Heart sounds: Normal heart sounds. No murmur.  Pulmonary:     Effort: Pulmonary effort is normal. No respiratory distress.     Breath sounds: Normal breath sounds. No stridor. No wheezing, rhonchi or rales.  Chest:     Chest wall: No tenderness.  Abdominal:     Palpations: Abdomen is soft.     Tenderness: There is no abdominal tenderness.  Musculoskeletal:        General: No swelling. Normal range of motion.     Cervical back: Normal range of motion and neck supple.  Skin:    General: Skin is warm and dry.     Capillary Refill: Capillary refill takes less than 2 seconds.  Neurological:     General: No focal deficit present.     Mental Status: She is alert and oriented to person, place, and time.  Psychiatric:        Mood and Affect: Mood normal.        Behavior: Behavior normal.        Thought Content: Thought content normal.      UC Treatments / Results  Labs (all labs ordered are listed,  but only abnormal results are displayed) Labs Reviewed - No data to display  EKG   Radiology No results found.  Procedures Procedures (including critical care time)  Medications Ordered in UC Medications - No data to display  Initial Impression / Assessment and Plan / UC Course  I have reviewed the triage vital signs and the nursing  notes.  Pertinent labs & imaging results that were available during my care of the patient were reviewed by me and considered in my medical decision making (see chart for details).     Anxiety with panic attacks: Presents with anxiety and panic attacks for the last month.  Has never taken any medications or tried therapy or counseling for this.  States that she has been practicing mindfulness and trying to take time for herself with little relief.  We will go ahead and start Zoloft 25 mg 1 tablet daily at bedtime.  We will also give Ativan 1 mg, half tablet 3 times a day as needed for panic attacks.  Discussed establishing primary care, patient reports that she is a patient at [facility] urgent care, discussed that they also do primary care and this is where she would like to go.  Provided resources as well for counseling and therapy with behavioral health for [facility] health and [place].  Discussed that if she needs refills and will need medication maintenance with primary care.  She will follow up with [facility] primary care. Patient verbalized understanding and is in agreement with treatment plan. Final Clinical Impressions(s) / UC Diagnoses   Final diagnoses:  Anxiety  Panic attack     Discharge Instructions     Establish primary care with [facility] urgent care.  Start Zoloft tonight.  You may take half tablet of Ativan as needed for panic attacks and days "thought tornadoes."  Follow up with [facility] or [facility] to get set up with counseling.   Follow up with primary care within the next month. I'd like you to be followed by the same practice for medication maintenance.   Genesight testing. Google and search for provider in your area if you have tried and failed 3 medications.       ED Prescriptions    Medication Sig Dispense Auth. Provider   sertraline (ZOLOFT) 25 MG tablet Take 1 tablet (25 mg total) by mouth daily. 30 tablet Moshe Cipro,  NP   LORazepam (ATIVAN) 1 MG tablet Take 0.5 tablets (0.5 mg total) by mouth 3 (three) times daily as needed for anxiety. 15 tablet Moshe Cipro, NP     I have reviewed the PDMP during this encounter.   Moshe Cipro, NP 10/01/19 [phone]

## 2019-10-01 NOTE — ED Triage Notes (Addendum)
Pt c/o anxietyx2 mos. Pt states she was having family issues 2 mos re: her boyfriend and child. Insomnia, loss of appetite on and off, can't focus, HA, nausea. Pt denies SI

## 2019-10-14 ENCOUNTER — Ambulatory Visit: Payer: Managed Care, Other (non HMO) | Attending: Internal Medicine

## 2019-10-14 DIAGNOSIS — Z20822 Contact with and (suspected) exposure to covid-19: Secondary | ICD-10-CM

## 2019-10-15 LAB — NOVEL CORONAVIRUS, NAA: SARS-CoV-2, NAA: NOT DETECTED

## 2019-10-15 LAB — SARS-COV-2, NAA 2 DAY TAT

## 2020-02-01 ENCOUNTER — Ambulatory Visit
Admission: EM | Admit: 2020-02-01 | Discharge: 2020-02-01 | Disposition: A | Discharge status: Home / Self Care | Payer: Medicaid Other | Attending: Emergency Medicine | Admitting: Emergency Medicine

## 2020-02-01 DIAGNOSIS — N926 Irregular menstruation, unspecified: Secondary | ICD-10-CM

## 2020-02-01 DIAGNOSIS — R059 Cough, unspecified: Secondary | ICD-10-CM

## 2020-02-01 DIAGNOSIS — Z3A08 8 weeks gestation of pregnancy: Secondary | ICD-10-CM

## 2020-02-01 DIAGNOSIS — J069 Acute upper respiratory infection, unspecified: Secondary | ICD-10-CM

## 2020-02-01 LAB — POCT URINE PREGNANCY: Preg Test, Ur: POSITIVE — AB

## 2020-02-01 NOTE — ED Provider Notes (Signed)
Christy Moran    CSN: [id number] Arrival date & time: 02/01/20  1317      History   Chief Complaint Chief Complaint  Patient presents with  . Fever  . Headache  . Nasal Congestion  . Cough  . Possible Pregnancy    HPI Christy Moran is a 19 y.o. female.   Patient presents with request for pregnancy test.  She states her last menstrual cycle was approximately June 25.  She also reports nonproductive cough, headache, nasal congestion, fever x2 days.  Treatment at home with Tylenol.  She denies rash, sore throat, shortness of breath, abdominal pain, vomiting, diarrhea, or other symptoms.  Patient is not vaccinated for COVID.  The history is provided by the patient.    Past Medical History:  Diagnosis Date  . Anemia   . Hx of gonorrhea     Patient Active Problem List   Diagnosis Date Noted  . Normal labor 07/17/2018  . Encounter for supervision of normal first pregnancy in first trimester 02/14/2018    Past Surgical History:  Procedure Laterality Date  . INDUCED ABORTION    . NO PAST SURGERIES      OB History    Gravida  1   Para      Term      Preterm      AB      Living        SAB      TAB      Ectopic      Multiple      Live Births               Home Medications    Prior to Admission medications   Medication Sig Start Date End Date Taking? Authorizing Provider  docusate sodium (COLACE) 100 MG capsule Take 1 capsule (100 mg total) by mouth 2 (two) times daily. 07/19/18   Waynard Reeds, MD  ferrous sulfate 325 (65 FE) MG tablet Take 325 mg by mouth daily with breakfast.    [provider]  ibuprofen (ADVIL,MOTRIN) 600 MG tablet Take 1 tablet (600 mg total) by mouth every 6 (six) hours as needed. 07/19/18   Waynard Reeds, MD  LORazepam (ATIVAN) 1 MG tablet Take 0.5 tablets (0.5 mg total) by mouth 3 (three) times daily as needed for anxiety. 10/01/19   Moshe Cipro, NP  metroNIDAZOLE (FLAGYL) 500 MG tablet Take 1 tablet  (500 mg total) by mouth 2 (two) times daily. 02/24/19   Pricilla Loveless, MD  naproxen (NAPROSYN) 375 MG tablet Take 1 tablet (375 mg total) by mouth 2 (two) times daily. 02/24/19   Pricilla Loveless, MD  Prenatal Vit-Fe Fumarate-FA (PRENATAL MULTIVITAMIN) TABS tablet Take 1 tablet by mouth daily at 12 noon.    [provider]  sertraline (ZOLOFT) 25 MG tablet Take 1 tablet (25 mg total) by mouth daily. 10/01/19   Moshe Cipro, NP    Family History Family History  Problem Relation Age of Onset  . Hypertension Mother   . Hypertension Father   . Stroke Maternal Uncle   . Cancer Maternal Grandfather        prostate  . Cancer Paternal Grandfather   . Drug abuse Neg Hx     Social History Social History   Tobacco Use  . Smoking status: Never Smoker  . Smokeless tobacco: Never Used  Vaping Use  . Vaping Use: Never used  Substance Use Topics  . Alcohol use: Yes    Comment:  occ  . Drug use: Never     Allergies   Patient has no known allergies.   Review of Systems Review of Systems  Constitutional: Positive for fever. Negative for chills.  HENT: Positive for congestion. Negative for ear pain and sore throat.   Eyes: Negative for pain and visual disturbance.  Respiratory: Positive for cough. Negative for shortness of breath.   Cardiovascular: Negative for chest pain and palpitations.  Gastrointestinal: Negative for abdominal pain, diarrhea and vomiting.  Genitourinary: Negative for dysuria and hematuria.  Musculoskeletal: Negative for arthralgias and back pain.  Skin: Negative for color change and rash.  Neurological: Positive for headaches. Negative for seizures, syncope, weakness and numbness.  All other systems reviewed and are negative.    Physical Exam Triage Vital Signs ED Triage Vitals  Enc Vitals Group     BP      Pulse      Resp      Temp      Temp src      SpO2      Weight      Height      Head Circumference      Peak Flow      Pain Score        Pain Loc      Pain Edu?      Excl. in GC?    No data found.  Updated Vital Signs BP 122/72 (BP Location: Left Arm)   Pulse (!) 101   Temp 100 F (37.8 C) (Oral)   Resp 20   LMP  (Within Weeks) Comment: 7-8 weeks  SpO2 98%   Visual Acuity Right Eye Distance:   Left Eye Distance:   Bilateral Distance:    Right Eye Near:   Left Eye Near:    Bilateral Near:     Physical Exam Vitals and nursing note reviewed.  Constitutional:      General: She is not in acute distress.    Appearance: She is well-developed. She is not ill-appearing.  HENT:     Head: Normocephalic and atraumatic.     Right Ear: Tympanic membrane normal.     Left Ear: Tympanic membrane normal.     Nose: Rhinorrhea present.     Mouth/Throat:     Mouth: Mucous membranes are moist.     Pharynx: Oropharynx is clear.  Eyes:     Conjunctiva/sclera: Conjunctivae normal.  Cardiovascular:     Rate and Rhythm: Normal rate and regular rhythm.     Heart sounds: No murmur heard.   Pulmonary:     Effort: Pulmonary effort is normal. No respiratory distress.     Breath sounds: Normal breath sounds. No wheezing or rhonchi.  Abdominal:     Palpations: Abdomen is soft.     Tenderness: There is no abdominal tenderness. There is no guarding or rebound.  Musculoskeletal:     Cervical back: Neck supple.  Skin:    General: Skin is warm and dry.     Findings: No rash.  Neurological:     General: No focal deficit present.     Mental Status: She is alert and oriented to person, place, and time.     Gait: Gait normal.  Psychiatric:        Mood and Affect: Mood normal.        Behavior: Behavior normal.      UC Treatments / Results  Labs (all labs ordered are listed, but only abnormal results are displayed) Labs Reviewed  POCT URINE PREGNANCY - Abnormal; Notable for the following components:      Result Value   Preg Test, Ur Positive (*)    All other components within normal limits  NOVEL CORONAVIRUS, NAA     EKG   Radiology No results found.  Procedures Procedures (including critical care time)  Medications Ordered in UC Medications - No data to display  Initial Impression / Assessment and Plan / UC Course  I have reviewed the triage vital signs and the nursing notes.  Pertinent labs & imaging results that were available during my care of the patient were reviewed by me and considered in my medical decision making (see chart for details).   [redacted] weeks gestation of pregnancy.  Viral URI.  Instructed patient to start prenatal vitamins today and schedule an appointment with her OB/GYN as soon as possible.  PCR COVID test performed here.  Instructed patient to self quarantine until the test result is back.  Discussed with patient that she can take Tylenol as needed for fever or discomfort.  Instructed patient to go to the emergency department if she develops high fever, shortness of breath, severe diarrhea, or other concerning symptoms.  Patient agrees with plan of care.     Final Clinical Impressions(s) / UC Diagnoses   Final diagnoses:  [redacted] weeks gestation of pregnancy  Viral upper respiratory tract infection     Discharge Instructions     You are pregnant.  Based on your last menstrual cycle being on June 25, you are approximately [redacted] weeks pregnant.  Start taking prenatal vitamins today.  Schedule an appointment with your OB/GYN as soon as possible.    Your COVID test is pending.  You should self quarantine until the test result is back.  Take Tylenol as needed for fever or discomfort.  Rest and keep yourself hydrated.   Go to the emergency department if you develop acute worsening symptoms.        ED Prescriptions    None     PDMP not reviewed this encounter.   Mickie Bail, NP 02/01/20 1404

## 2020-02-01 NOTE — ED Triage Notes (Signed)
Pt reports cough, headache, nasal congestion x 2 days; fever x 1 day. Tylenol gives some relief.    Pt requested pregnancy test.

## 2020-02-01 NOTE — Discharge Instructions (Addendum)
You are pregnant.  Based on your last menstrual cycle being on June 25, you are approximately [redacted] weeks pregnant.  Start taking prenatal vitamins today.  Schedule an appointment with your OB/GYN as soon as possible.    Your COVID test is pending.  You should self quarantine until the test result is back.  Take Tylenol as needed for fever or discomfort.  Rest and keep yourself hydrated.   Go to the emergency department if you develop acute worsening symptoms.

## 2020-02-03 LAB — SARS-COV-2, NAA 2 DAY TAT

## 2020-02-03 LAB — NOVEL CORONAVIRUS, NAA: SARS-CoV-2, NAA: DETECTED — AB

## 2020-02-10 ENCOUNTER — Other Ambulatory Visit: Payer: Medicaid Other

## 2020-02-10 ENCOUNTER — Other Ambulatory Visit: Payer: Self-pay

## 2020-02-10 ENCOUNTER — Ambulatory Visit: Payer: Self-pay

## 2020-02-10 DIAGNOSIS — Z20822 Contact with and (suspected) exposure to covid-19: Secondary | ICD-10-CM

## 2020-02-11 ENCOUNTER — Ambulatory Visit
Admission: RE | Admit: 2020-02-11 | Discharge: 2020-02-11 | Disposition: A | Discharge status: Home / Self Care | Payer: Medicaid Other | Source: Ambulatory Visit | Attending: Family Medicine | Admitting: Family Medicine

## 2020-02-11 ENCOUNTER — Other Ambulatory Visit: Payer: Self-pay

## 2020-02-11 DIAGNOSIS — Z20822 Contact with and (suspected) exposure to covid-19: Secondary | ICD-10-CM | POA: Insufficient documentation

## 2020-02-11 DIAGNOSIS — Z1152 Encounter for screening for COVID-19: Secondary | ICD-10-CM | POA: Diagnosis not present

## 2020-02-11 NOTE — ED Triage Notes (Signed)
Patient needs a covid test to be able to return to work. Patient denies having any symptoms.

## 2020-02-11 NOTE — Discharge Instructions (Signed)

## 2020-02-12 LAB — NOVEL CORONAVIRUS, NAA: SARS-CoV-2, NAA: NOT DETECTED

## 2020-02-12 LAB — SARS CORONAVIRUS 2 (TAT 6-24 HRS): SARS Coronavirus 2: NEGATIVE

## 2020-03-14 ENCOUNTER — Emergency Department (HOSPITAL_BASED_OUTPATIENT_CLINIC_OR_DEPARTMENT_OTHER)
Admission: EM | Admit: 2020-03-14 | Discharge: 2020-03-14 | Disposition: A | Discharge status: Home / Self Care | Payer: Medicaid Other | Attending: Emergency Medicine | Admitting: Emergency Medicine

## 2020-03-14 ENCOUNTER — Other Ambulatory Visit: Payer: Self-pay

## 2020-03-14 ENCOUNTER — Encounter (HOSPITAL_BASED_OUTPATIENT_CLINIC_OR_DEPARTMENT_OTHER): Payer: Self-pay | Admitting: Emergency Medicine

## 2020-03-14 DIAGNOSIS — Z20822 Contact with and (suspected) exposure to covid-19: Secondary | ICD-10-CM | POA: Diagnosis not present

## 2020-03-14 DIAGNOSIS — O99512 Diseases of the respiratory system complicating pregnancy, second trimester: Secondary | ICD-10-CM | POA: Diagnosis not present

## 2020-03-14 DIAGNOSIS — J029 Acute pharyngitis, unspecified: Secondary | ICD-10-CM | POA: Diagnosis not present

## 2020-03-14 DIAGNOSIS — O26892 Other specified pregnancy related conditions, second trimester: Secondary | ICD-10-CM | POA: Insufficient documentation

## 2020-03-14 DIAGNOSIS — R519 Headache, unspecified: Secondary | ICD-10-CM | POA: Diagnosis not present

## 2020-03-14 DIAGNOSIS — Z3A14 14 weeks gestation of pregnancy: Secondary | ICD-10-CM | POA: Insufficient documentation

## 2020-03-14 DIAGNOSIS — H53149 Visual discomfort, unspecified: Secondary | ICD-10-CM | POA: Insufficient documentation

## 2020-03-14 LAB — RESPIRATORY PANEL BY RT PCR (FLU A&B, COVID)
Influenza A by PCR: NEGATIVE
Influenza B by PCR: NEGATIVE
SARS Coronavirus 2 by RT PCR: NEGATIVE

## 2020-03-14 NOTE — ED Triage Notes (Addendum)
Pt having HA for a week with stiff neck and body aches. Only has HA when she is working, watching TV, is outside in the sun, etc.  No HA since last night. Vomiting more than usual.  Is [redacted] weeks pregnant.

## 2020-03-14 NOTE — ED Provider Notes (Signed)
[facility] EMERGENCY DEPARTMENT Provider Note   CSN: [id number] Arrival date & time: 03/14/20  1012     History Chief Complaint  Patient presents with  . Headache    Christy Moran is a 19 y.o. female with past medical history of migraine [redacted] weeks pregnant who presents to the ED for complaint of headache.  Patient states that symptoms of headache, neck stiffness started last Wednesday.  She also complains of coughing, sore throat, feeling fatigued.  She denies shortness of breath and fever.  Patient endorses photophobia and has to lay down to rest when headache happens.  Tylenol provides some relief.  She did have history of migraine in the past and states that this feels similar to her past episode but worse.  She denies sick contacts but states that her nephew just started school and they are living together.  HPI     Past Medical History:  Diagnosis Date  . Anemia   . Hx of gonorrhea     Patient Active Problem List   Diagnosis Date Noted  . Normal labor 07/17/2018  . Encounter for supervision of normal first pregnancy in first trimester 02/14/2018    Past Surgical History:  Procedure Laterality Date  . INDUCED ABORTION    . NO PAST SURGERIES       OB History    Gravida  2   Para      Term      Preterm      AB      Living        SAB      TAB      Ectopic      Multiple      Live Births              Family History  Problem Relation Age of Onset  . Hypertension Mother   . Hypertension Father   . Stroke Maternal Uncle   . Cancer Maternal Grandfather        prostate  . Cancer Paternal Grandfather   . Drug abuse Neg Hx     Social History   Tobacco Use  . Smoking status: Never Smoker  . Smokeless tobacco: Never Used  Vaping Use  . Vaping Use: Never used  Substance Use Topics  . Alcohol use: Yes    Comment: occ  . Drug use: Never    Home Medications Prior to Admission medications   Medication Sig Start Date End Date  Taking? Authorizing Provider  docusate sodium (COLACE) 100 MG capsule Take 1 capsule (100 mg total) by mouth 2 (two) times daily. 07/19/18   Waynard Reeds, MD  ferrous sulfate 325 (65 FE) MG tablet Take 325 mg by mouth daily with breakfast.    [provider]  ibuprofen (ADVIL,MOTRIN) 600 MG tablet Take 1 tablet (600 mg total) by mouth every 6 (six) hours as needed. 07/19/18   Waynard Reeds, MD  LORazepam (ATIVAN) 1 MG tablet Take 0.5 tablets (0.5 mg total) by mouth 3 (three) times daily as needed for anxiety. 10/01/19   Moshe Cipro, NP  metroNIDAZOLE (FLAGYL) 500 MG tablet Take 1 tablet (500 mg total) by mouth 2 (two) times daily. 02/24/19   Pricilla Loveless, MD  naproxen (NAPROSYN) 375 MG tablet Take 1 tablet (375 mg total) by mouth 2 (two) times daily. 02/24/19   Pricilla Loveless, MD  Prenatal Vit-Fe Fumarate-FA (PRENATAL MULTIVITAMIN) TABS tablet Take 1 tablet by mouth daily at 12 noon.    [provider]  sertraline (ZOLOFT) 25 MG tablet Take 1 tablet (25 mg total) by mouth daily. 10/01/19   Moshe Cipro, NP    Allergies    Patient has no known allergies.  Review of Systems   Review of Systems  Constitutional: Positive for fatigue. Negative for fever.  HENT: Positive for sore throat.   Respiratory: Negative for shortness of breath.   Musculoskeletal: Positive for neck stiffness. Negative for arthralgias.  Skin: Negative for rash.  Neurological: Positive for headaches. Negative for speech difficulty.    Physical Exam Updated Vital Signs BP 115/65 (BP Location: Right Arm)   Pulse 77   Temp 98 F (36.7 C) (Oral)   Resp 16   Ht 5\' 4"  (1.626 m)   Wt 69.4 kg   LMP 06/18/2019 (Approximate)   SpO2 100%   BMI 26.28 kg/m   Physical Exam Constitutional:      General: She is not in acute distress. HENT:     Head: Normocephalic.  Eyes:     General: No scleral icterus.    Pupils: Pupils are equal, round, and reactive to light.  Cardiovascular:     Rate and  Rhythm: Normal rate and regular rhythm.     Heart sounds: No murmur heard.   Pulmonary:     Effort: No respiratory distress.     Breath sounds: No wheezing.  Abdominal:     Palpations: Abdomen is soft.  Musculoskeletal:        General: Normal range of motion.     Cervical back: Normal range of motion. No rigidity.     Comments: Negative for neck stiffness or tenderness  Skin:    General: Skin is warm.     Coloration: Skin is not cyanotic.  Neurological:     General: No focal deficit present.     Mental Status: She is alert.     Comments: Provider Cranial nerve no deficit Normal strength and sensation upper and lower extremity bilaterally  Psychiatric:        Mood and Affect: Mood normal.     ED Results / Procedures / Treatments   Labs (all labs ordered are listed, but only abnormal results are displayed) Labs Reviewed  RESPIRATORY PANEL BY RT PCR (FLU A&B, COVID)    EKG None  Radiology No results found.  Procedures Procedures (including critical care time)  Medications Ordered in ED Medications - No data to display  ED Course  I have reviewed the triage vital signs and the nursing notes.  Pertinent labs & imaging results that were available during my care of the patient were reviewed by me and considered in my medical decision making (see chart for details).  Patient seen and examined.  She complains of headache and neck stiffness that started last Wednesday and happens every day.  She also have sore throat and coughing.  When headaches occurs, she endorses photophobia and needs to lay down in a dark room to get better.  Patient reports history of migraine in the past and this feels similar to a migraine episode but worse.  Patient denies headache at this time.  Vital signs stable.  BP 115/65 (BP Location: Right Arm)   Pulse 77   Temp 98 F (36.7 C) (Oral)   Resp 16   Ht 5\' 4"  (1.626 m)   Wt 69.4 kg   LMP 06/18/2019 (Approximate)   SpO2 100%   BMI 26.28  kg/m   This is likely migraine headache.  Patient is instructed  to continue Tylenol for headache relief.  She is advised to make an appointment with her OB/GYN for her pregnancy and for migraine therapy.  Follow-up with PCP.  Come back to the ED for worsening symptoms.  She verbalized understand   MDM Rules/Calculators/A&P                          Patient presented with headache and neck stiffness.  Also complains of photophobia.  She reports history of migraine in the past and states that this feels like a migraine headache.  Physical exam is negative for neurological deficit or neck rigidity.  This is likely a migraine headache.  Continue Tylenol for headache relief.  Due to pregnancy, she is advised follow-up with her OB/GYN in case she needs to be started on preventive and abortive therapy for migraines.  Covid swab today.  Come back to the ED for worsening symptoms.  Final Clinical Impression(s) / ED Diagnoses Final diagnoses:  Acute nonintractable headache, unspecified headache type    Rx / DC Orders ED Discharge Orders    None       Doran Stabler, DO 03/14/20 1408    Alvira Monday, MD 03/15/20 2138

## 2020-04-01 LAB — OB RESULTS CONSOLE HEPATITIS B SURFACE ANTIGEN: Hepatitis B Surface Ag: NEGATIVE

## 2020-04-01 LAB — OB RESULTS CONSOLE HIV ANTIBODY (ROUTINE TESTING): HIV: NONREACTIVE

## 2020-04-01 LAB — OB RESULTS CONSOLE RUBELLA ANTIBODY, IGM: Rubella: IMMUNE

## 2020-05-10 ENCOUNTER — Encounter (HOSPITAL_COMMUNITY): Payer: Self-pay | Admitting: Obstetrics & Gynecology

## 2020-05-10 ENCOUNTER — Inpatient Hospital Stay (HOSPITAL_COMMUNITY)
Admission: AD | Admit: 2020-05-10 | Discharge: 2020-05-11 | Disposition: A | Discharge status: Home / Self Care | Payer: Medicaid Other | Attending: Obstetrics & Gynecology | Admitting: Obstetrics & Gynecology

## 2020-05-10 ENCOUNTER — Other Ambulatory Visit: Payer: Self-pay

## 2020-05-10 DIAGNOSIS — Z3A22 22 weeks gestation of pregnancy: Secondary | ICD-10-CM | POA: Diagnosis not present

## 2020-05-10 DIAGNOSIS — O4702 False labor before 37 completed weeks of gestation, second trimester: Secondary | ICD-10-CM | POA: Diagnosis not present

## 2020-05-10 LAB — URINALYSIS, ROUTINE W REFLEX MICROSCOPIC
Bilirubin Urine: NEGATIVE
Glucose, UA: NEGATIVE mg/dL
Hgb urine dipstick: NEGATIVE
Ketones, ur: NEGATIVE mg/dL
Leukocytes,Ua: NEGATIVE
Nitrite: NEGATIVE
Protein, ur: NEGATIVE mg/dL
Specific Gravity, Urine: 1.01 (ref 1.005–1.030)
pH: 6 (ref 5.0–8.0)

## 2020-05-10 MED ORDER — LACTATED RINGERS IV BOLUS
1000.0000 mL | Freq: Once | INTRAVENOUS | Status: AC
  Administered 2020-05-10: 1000 mL via INTRAVENOUS

## 2020-05-10 MED ORDER — CYCLOBENZAPRINE HCL 5 MG PO TABS
10.0000 mg | ORAL_TABLET | Freq: Once | ORAL | Status: AC
  Administered 2020-05-11: 10 mg via ORAL
  Filled 2020-05-10: qty 2

## 2020-05-10 NOTE — MAU Note (Signed)
PT SAYS SHE NOTICED  3 WEEKS AGO - UC'S. NOT CONSISTENT . TODAY FEELS SHARP PAIN IN RECTUM AND TIGHTNESS IN ABD . [facility] - VICKI, CNM .  LAST SEX-  1 WEEK AGO

## 2020-05-10 NOTE — MAU Provider Note (Signed)
History     CSN: [id number]  Arrival date and time: 05/10/20 2238   First Provider Initiated Contact with Patient 05/10/20 2318       Chief Complaint  Patient presents with  . Contractions   Christy Moran is a 19 y.o. G3P1 at [redacted]w[redacted]d who presents to MAU with complaints of contractions. Patient reports that contractions have been occurring over the past 2-3 weeks but got worse over the past 2 days. Describes contractions as tightening and sharp cramping in back and bottom where she is unable to move or talk when it is occurring. Rates pain 2-3/10- patient has tried tylenol for pain which does not help. Patient reports that the pain does get worse with movement.  Denies hx of PTL or PTD. Denies recent IC. Denies vaginal bleeding or discharge. Patient reports last BM yesterday that was normal. Patient reports good fetal movement. Patient reports drinking 3-4 bottles of water per day.    OB History    Gravida  3   Para  1   Term      Preterm      AB  1   Living  1     SAB      TAB  1   Ectopic      Multiple      Live Births              Past Medical History:  Diagnosis Date  . Anemia   . Hx of gonorrhea     Past Surgical History:  Procedure Laterality Date  . INDUCED ABORTION    . NO PAST SURGERIES      Family History  Problem Relation Age of Onset  . Hypertension Mother   . Hypertension Father   . Stroke Maternal Uncle   . Cancer Maternal Grandfather        prostate  . Cancer Paternal Grandfather   . Drug abuse Neg Hx     Social History   Tobacco Use  . Smoking status: Never Smoker  . Smokeless tobacco: Never Used  Vaping Use  . Vaping Use: Never used  Substance Use Topics  . Alcohol use: Not Currently    Comment: occ  . Drug use: Never    Allergies: No Known Allergies  Medications Prior to Admission  Medication Sig Dispense Refill Last Dose  . Prenatal Vit-Fe Fumarate-FA (PRENATAL MULTIVITAMIN) TABS tablet Take 1 tablet by mouth  daily at 12 noon.   05/10/2020 at Unknown time  . docusate sodium (COLACE) 100 MG capsule Take 1 capsule (100 mg total) by mouth 2 (two) times daily. 60 capsule 0   . ferrous sulfate 325 (65 FE) MG tablet Take 325 mg by mouth daily with breakfast.     . ibuprofen (ADVIL,MOTRIN) 600 MG tablet Take 1 tablet (600 mg total) by mouth every 6 (six) hours as needed. 90 tablet 0   . LORazepam (ATIVAN) 1 MG tablet Take 0.5 tablets (0.5 mg total) by mouth 3 (three) times daily as needed for anxiety. 15 tablet 0   . metroNIDAZOLE (FLAGYL) 500 MG tablet Take 1 tablet (500 mg total) by mouth 2 (two) times daily. 14 tablet 0   . naproxen (NAPROSYN) 375 MG tablet Take 1 tablet (375 mg total) by mouth 2 (two) times daily. 20 tablet 0   . sertraline (ZOLOFT) 25 MG tablet Take 1 tablet (25 mg total) by mouth daily. 30 tablet 0     Review of Systems  Constitutional: Negative.  Respiratory: Negative.   Cardiovascular: Negative.   Gastrointestinal: Positive for abdominal pain. Negative for constipation, diarrhea, nausea and vomiting.       Contractions and cramping  Genitourinary: Negative.   Musculoskeletal: Positive for back pain.  Neurological: Negative.   Psychiatric/Behavioral: Negative.    Physical Exam   Blood pressure 119/63, pulse 87, temperature 98.3 F (36.8 C), temperature source Oral, resp. rate 18, height 5\' 4"  (1.626 m), weight 73.8 kg, last menstrual period 06/18/2019, unknown if currently breastfeeding.  Physical Exam Vitals and nursing note reviewed.  HENT:     Head: Normocephalic.  Cardiovascular:     Rate and Rhythm: Normal rate and regular rhythm.  Pulmonary:     Effort: Pulmonary effort is normal. No respiratory distress.     Breath sounds: Normal breath sounds. No wheezing.  Abdominal:     Palpations: Abdomen is soft. There is no mass.     Tenderness: There is no abdominal tenderness. There is no right CVA tenderness, left CVA tenderness or guarding.     Comments: Gravid  appropriate for gestational age  Musculoskeletal:     Right lower leg: No edema.     Left lower leg: No edema.  Neurological:     Mental Status: She is alert and oriented to person, place, and time.  Psychiatric:        Mood and Affect: Mood normal.        Behavior: Behavior normal.        Thought Content: Thought content normal.     FHR 155 by doppler  Toco noted UI   MAU Course  Procedures  MDM FFN collected prior to cervical examination  Cervix closed internally   Treatment in MAU including Flexeril and IV LR bolus for UI on TOCO.  Meds ordered this encounter  Medications  . lactated ringers bolus 1,000 mL  . cyclobenzaprine (FLEXERIL) tablet 10 mg   Reassessment @ 0051 - patient reports that contractions and cramping is feeling better but not resolved.  Toco notes contractions every 3-6 minutes/mild by palpation   Procardia series ordered and FFN that was collected sent.  Labs reviewed:  Results for orders placed or performed during the hospital encounter of 05/10/20 (from the past 24 hour(s))  Urinalysis, Routine w reflex microscopic Urine, Clean Catch     Status: Abnormal   Collection Time: 05/10/20 10:56 PM  Result Value Ref Range   Color, Urine STRAW (A) YELLOW   APPearance CLEAR CLEAR   Specific Gravity, Urine 1.010 1.005 - 1.030   pH 6.0 5.0 - 8.0   Glucose, UA NEGATIVE NEGATIVE mg/dL   Hgb urine dipstick NEGATIVE NEGATIVE   Bilirubin Urine NEGATIVE NEGATIVE   Ketones, ur NEGATIVE NEGATIVE mg/dL   Protein, ur NEGATIVE NEGATIVE mg/dL   Nitrite NEGATIVE NEGATIVE   Leukocytes,Ua NEGATIVE NEGATIVE  Fetal fibronectin     Status: None   Collection Time: 05/10/20 11:57 PM  Result Value Ref Range   Fetal Fibronectin NEGATIVE NEGATIVE   Reassessment after Procardia series @0155  - patient reports pain is resolved and no longer feels contractions or cramping. Cervix unchanged.   Educated and discussed with patient PTL precautions and reasons to present to MAU.  Follow up as scheduled in the office tomorrow for prenatal care. Pt stable at time of discharge.   Assessment and Plan   1. Preterm uterine contractions in second trimester, antepartum   2. [redacted] weeks gestation of pregnancy    Discharge home Follow up as scheduled in the  office for prenatal care Return to MAU as needed for reasons discussed and/or emergencies  PTL precautions discussed    Follow-up Information    [facility], [facility]. Go on 05/12/2020.   Specialty: Obstetrics and Gynecology Contact information: [address] [place] [address] [phone]              Allergies as of 05/11/2020   No Known Allergies     Medication List    STOP taking these medications   ibuprofen 600 MG tablet Commonly known as: ADVIL   metroNIDAZOLE 500 MG tablet Commonly known as: Flagyl   naproxen 375 MG tablet Commonly known as: NAPROSYN     TAKE these medications   docusate sodium 100 MG capsule Commonly known as: Colace Take 1 capsule (100 mg total) by mouth 2 (two) times daily.   ferrous sulfate 325 (65 FE) MG tablet Take 325 mg by mouth daily with breakfast.   LORazepam 1 MG tablet Commonly known as: Ativan Take 0.5 tablets (0.5 mg total) by mouth 3 (three) times daily as needed for anxiety.   prenatal multivitamin Tabs tablet Take 1 tablet by mouth daily at 12 noon.   sertraline 25 MG tablet Commonly known as: Zoloft Take 1 tablet (25 mg total) by mouth daily.       Sharyon Cable CNM 05/11/2020, 2:11 AM

## 2020-05-11 DIAGNOSIS — O4702 False labor before 37 completed weeks of gestation, second trimester: Secondary | ICD-10-CM | POA: Diagnosis not present

## 2020-05-11 LAB — FETAL FIBRONECTIN: Fetal Fibronectin: NEGATIVE

## 2020-05-11 MED ORDER — NIFEDIPINE 10 MG PO CAPS
10.0000 mg | ORAL_CAPSULE | ORAL | Status: AC | PRN
  Administered 2020-05-11 (×3): 10 mg via ORAL
  Filled 2020-05-11 (×3): qty 1

## 2020-06-08 LAB — OB RESULTS CONSOLE GC/CHLAMYDIA
Chlamydia: NEGATIVE
Gonorrhea: NEGATIVE

## 2020-06-11 NOTE — L&D Delivery Note (Signed)
Delivery Note Labor onset:09/15/20   Labor Onset Time: 0300 Complete dilation at 9:33 AM  Onset of pushing at 0941 FHR second stage Cat 2 Analgesia/Anesthesia intrapartum: Epidural  Guided pushing with maternal urge. Delivery of a viable female at 38. Fetal head delivered in OA with restitution to LOA position.  Nuchal cord: N/A.  Infant placed on maternal abd, dried, and tactile stim.  Cord double clamped after pulsation ceased and cut by FOB Active management of third stage.  NICU present for birth d/t thick meconium.  Cord blood sample collected Arterial cord blood sample collected: N/A  Placenta delivered Tomasa Blase via Binnie Kand Maneuver, intact, with 3 VC.  Placenta to L&D. Uterine tone firm, bleeding minimal  No laceration identified.  Anesthesia: epidural Repair: N/A QBL/EBL (mL): 150 Complications: N/A APGAR: APGAR (1 MIN): 8   APGAR (5 MINS): 9   APGAR (10 MINS):   Mom to postpartum.  Baby to Couplet care / Skin to Skin. Dr Sallye Ober notified of pt status and POC.  Gerhard Munch Elna Radovich MSN, CNM 09/15/2020, 10:12 AM

## 2020-07-05 ENCOUNTER — Other Ambulatory Visit: Payer: Medicaid Other

## 2020-07-31 ENCOUNTER — Encounter (HOSPITAL_COMMUNITY): Payer: Self-pay | Admitting: Obstetrics and Gynecology

## 2020-07-31 ENCOUNTER — Inpatient Hospital Stay (HOSPITAL_COMMUNITY)
Admission: AD | Admit: 2020-07-31 | Discharge: 2020-07-31 | Disposition: A | Discharge status: Home / Self Care | Payer: Medicaid Other | Attending: Obstetrics and Gynecology | Admitting: Obstetrics and Gynecology

## 2020-07-31 ENCOUNTER — Other Ambulatory Visit: Payer: Self-pay

## 2020-07-31 DIAGNOSIS — D509 Iron deficiency anemia, unspecified: Secondary | ICD-10-CM | POA: Diagnosis not present

## 2020-07-31 DIAGNOSIS — O26813 Pregnancy related exhaustion and fatigue, third trimester: Secondary | ICD-10-CM | POA: Diagnosis not present

## 2020-07-31 DIAGNOSIS — D508 Other iron deficiency anemias: Secondary | ICD-10-CM | POA: Diagnosis not present

## 2020-07-31 DIAGNOSIS — R42 Dizziness and giddiness: Secondary | ICD-10-CM | POA: Diagnosis present

## 2020-07-31 DIAGNOSIS — Z3A34 34 weeks gestation of pregnancy: Secondary | ICD-10-CM | POA: Diagnosis not present

## 2020-07-31 DIAGNOSIS — O99013 Anemia complicating pregnancy, third trimester: Secondary | ICD-10-CM

## 2020-07-31 LAB — CBC
HCT: 30.3 % — ABNORMAL LOW (ref 36.0–46.0)
Hemoglobin: 9.6 g/dL — ABNORMAL LOW (ref 12.0–15.0)
MCH: 27.7 pg (ref 26.0–34.0)
MCHC: 31.7 g/dL (ref 30.0–36.0)
MCV: 87.6 fL (ref 80.0–100.0)
Platelets: 148 10*3/uL — ABNORMAL LOW (ref 150–400)
RBC: 3.46 MIL/uL — ABNORMAL LOW (ref 3.87–5.11)
RDW: 13.7 % (ref 11.5–15.5)
WBC: 6.5 10*3/uL (ref 4.0–10.5)
nRBC: 0 % (ref 0.0–0.2)

## 2020-07-31 LAB — COMPREHENSIVE METABOLIC PANEL
ALT: 11 U/L (ref 0–44)
AST: 14 U/L — ABNORMAL LOW (ref 15–41)
Albumin: 2.4 g/dL — ABNORMAL LOW (ref 3.5–5.0)
Alkaline Phosphatase: 87 U/L (ref 38–126)
Anion gap: 7 (ref 5–15)
BUN: 8 mg/dL (ref 6–20)
CO2: 24 mmol/L (ref 22–32)
Calcium: 8.3 mg/dL — ABNORMAL LOW (ref 8.9–10.3)
Chloride: 102 mmol/L (ref 98–111)
Creatinine, Ser: 0.7 mg/dL (ref 0.44–1.00)
GFR, Estimated: >60 mL/min (ref >60)
Glucose, Bld: 84 mg/dL (ref 70–99)
Potassium: 4 mmol/L (ref 3.5–5.1)
Sodium: 133 mmol/L — ABNORMAL LOW (ref 135–145)
Total Bilirubin: 0.1 mg/dL — ABNORMAL LOW (ref 0.3–1.2)
Total Protein: 5.5 g/dL — ABNORMAL LOW (ref 6.5–8.1)

## 2020-07-31 LAB — URINALYSIS, ROUTINE W REFLEX MICROSCOPIC
Bilirubin Urine: NEGATIVE
Glucose, UA: NEGATIVE mg/dL
Hgb urine dipstick: NEGATIVE
Ketones, ur: NEGATIVE mg/dL
Leukocytes,Ua: NEGATIVE
Nitrite: NEGATIVE
Protein, ur: NEGATIVE mg/dL
Specific Gravity, Urine: 1.012 (ref 1.005–1.030)
pH: 7 (ref 5.0–8.0)

## 2020-07-31 MED ORDER — DOXYLAMINE-PYRIDOXINE 10-10 MG PO TBEC: 1.0000 | DELAYED_RELEASE_TABLET | Freq: Two times a day (BID) | ORAL | 1 refills | Status: DC

## 2020-07-31 NOTE — MAU Provider Note (Addendum)
Patient Christy Moran is a 20 y.o. G3P1011 at [redacted]w[redacted]d here with complaints of weakness and dehydrated. She reports a loss of appetite due to nausea; however, she has had no vomiting. She denies vaginal bleeding, LOF, decreased fetal movements, contractions.  She has had no complications in this pregnancy; she does not take any medications other than iron supplements. She denies any c/sections in the past.   She reports some cramps/contractions that she describes as Deberah Pelton.   She has a two year old. She reports that she feels very tired from taking care of her two year old. She works at a desk all day.    History     CSN: [id number]  Arrival date and time: 07/31/20 0941   Event Date/Time   First Provider Initiated Contact with Patient 07/31/20 1057      Chief Complaint  Patient presents with  . Dehydration   Dizziness This is a new problem. The current episode started yesterday. The problem occurs constantly. Associated symptoms include abdominal pain. Associated symptoms comments: BH. The symptoms are aggravated by standing. Treatments tried: increased PO.  She feels like she doesn't have any energy. She hasn't fallen or hit her head. She felt winded after walking from the parking deck to here. Yesterday she could not climb her steps without sitting down.   She has been taking iron supplements for 2 months. She reports also drinking a lot of milk this week.   OB History    Gravida  3   Para  1   Term  1   Preterm      AB  1   Living  1     SAB      IAB  1   Ectopic      Multiple      Live Births  1           Past Medical History:  Diagnosis Date  . Anemia   . Hx of gonorrhea     Past Surgical History:  Procedure Laterality Date  . INDUCED ABORTION    . NO PAST SURGERIES      Family History  Problem Relation Age of Onset  . Hypertension Mother   . Hypertension Father   . Stroke Maternal Uncle   . Cancer Maternal Grandfather         prostate  . Cancer Paternal Grandfather   . Drug abuse Neg Hx     Social History   Tobacco Use  . Smoking status: Never Smoker  . Smokeless tobacco: Never Used  Vaping Use  . Vaping Use: Never used  Substance Use Topics  . Alcohol use: Not Currently    Comment: occ  . Drug use: Never    Allergies: No Known Allergies  No medications prior to admission.    Review of Systems  Constitutional: Negative.   HENT: Negative.   Gastrointestinal: Positive for abdominal pain.       C/w Braxton Hicks contractions  Genitourinary: Negative.   Musculoskeletal: Negative.   Neurological: Positive for dizziness.  Psychiatric/Behavioral: Negative.    Physical Exam   Blood pressure (!) 103/59, pulse 87, temperature 98.1 F (36.7 C), temperature source Oral, resp. rate 15, last menstrual period 06/18/2019, SpO2 99 %, unknown if currently breastfeeding.  Physical Exam Constitutional:      Appearance: Normal appearance.  Cardiovascular:     Pulses: Normal pulses.  Pulmonary:     Effort: Pulmonary effort is normal.  Abdominal:  General: Abdomen is flat.  Skin:    General: Skin is warm.  Neurological:     General: No focal deficit present.     Mental Status: She is alert.     MAU Course  Procedures  MDM -patient's urine shows no sign of dehydration; PO hydration and crackers offered which patient accepted.  -CBC shows Hgb of 9.6, appears to be her baseline    Patient rested and ate while in MAU; reports some occasional BH contractions. She denies any HA, SOB, or other complaints. Otherwise feels well after rest and snack.  -NST: 135 bpm, mod var, present acel, no decels, uterine irratability. No cervical exam performed as patient did not have any labor complaints.  Patient Vitals for the past 24 hrs:  BP Temp Temp src Pulse Resp SpO2  07/31/20 1245 (!) 103/59 - - 87 - -  07/31/20 1010 125/65 98.1 F (36.7 C) Oral 80 15 99 %     Assessment and Plan   1. Fatigue  during pregnancy, third trimester   2. Other iron deficiency anemia    -try taking iron every other day to see if that improves; separate milk intake from iron intake, take iron with citrus to help with absorbtion -Rx given for diclegis with instructions on how to take, discussed increasing dosage if not helping.  -Keep next appt tomorrow at Dr. Redmond Baseman office; discuss whether provider wants to give iron infusions at outpatient infusion center.  -All questions answered.   Charlesetta Garibaldi Chong Wojdyla 07/31/2020, 3:10 PM

## 2020-07-31 NOTE — Discharge Instructions (Signed)
-discuss hemoglobin of 9.6 with Dr. Normand Sloop on Monday; you may be a candidate for outpatient iron infusions.    Iron Deficiency Anemia, Adult Iron deficiency anemia is when you do not have enough red blood cells or hemoglobin in your blood. This happens because you have too little iron in your body. Hemoglobin carries oxygen to parts of the body. Anemia can cause your body to not get enough oxygen. What are the causes?  Not eating enough foods that have iron in them.  The body not being able to take in iron well.  Needing more iron due to pregnancy or heavy menstrual periods, for females.  Cancer.  Bleeding in the bowels.  Many blood draws. What increases the risk?  Being pregnant.  Being a teenage girl going through a growth spurt. What are the signs or symptoms?  Pale skin, lips, and nails.  Weakness, dizziness, and getting tired easily.  Headache.  Feeling like you cannot breathe well when moving (shortness of breath).  Cold hands and feet.  Fast heartbeat or a heartbeat that is not regular.  Feeling grouchy (irritable) or breathing fast. These are more common in very bad anemia. Mild anemia may not cause any symptoms. How is this treated? This condition is treated by finding out why you do not have enough iron and then getting more iron. It may include:  Adding foods to your diet that have a lot of iron.  Taking iron pills (supplements). If you are pregnant or breastfeeding, you may need to take extra iron. Your diet often does not provide the amount of iron that you need.  Getting more vitamin C in your diet. Vitamin C helps your body take in iron. You may need to take iron pills with a glass of orange juice or vitamin C pills.  Medicines to make heavy menstrual periods lighter.  Surgery. You may need blood tests to see if treatment is working. If the treatment does not seem to be working, you may need more tests. Follow these instructions at  home: Medicines  Take over-the-counter and prescription medicines only as told by your doctor. This includes iron pills and vitamins. ? Take iron pills when your stomach is empty. If you cannot handle this, take them with food. ? Do not drink milk or take antacids at the same time as your iron pills. ? Iron pills may turn your poop (stool)black.  If you cannot handle taking iron pills by mouth, ask your doctor about getting iron through: ? An IV tube. ? A shot (injection) into a muscle. Eating and drinking  Talk with your doctor before changing the foods you eat. He or she may tell you to eat foods that have a lot of iron, such as: ? Liver. ? Low-fat (lean) beef. ? Breads and cereals that have iron added to them. ? Eggs. ? Dried fruit. ? Dark green, leafy vegetables.  Eat fresh fruits and vegetables that are high in vitamin C. They help your body use iron. Foods with a lot of vitamin C include: ? Oranges. ? Peppers. ? Tomatoes. ? Mangoes.  Drink enough fluid to keep your pee (urine) pale yellow.   Managing constipation If you are taking iron pills, they may cause trouble pooping (constipation). To prevent or treat trouble pooping, you may need to:  Take over-the-counter or prescription medicines.  Eat foods that are high in fiber. These include beans, whole grains, and fresh fruits and vegetables.  Limit foods that are high in fat  and sugar. These include fried or sweet foods. General instructions  Return to your normal activities as told by your doctor. Ask your doctor what activities are safe for you.  Keep yourself clean, and keep things clean around you.  Keep all follow-up visits as told by your doctor. This is important. Contact a doctor if:  You feel like you may vomit (nauseous), or you vomit.  You feel weak.  You are sweating for no reason.  You have trouble pooping, such as: ? Pooping less than 3 times a week. ? Straining to poop. ? Having poop that is  hard, dry, or larger than normal. ? Feeling full or bloated. ? Pain in the lower belly. ? Not feeling better after pooping. Get help right away if:  You pass out (faint).  You have chest pain.  You have trouble breathing that: ? Is very bad. ? Gets worse with physical activity.  You have a fast heartbeat, or a heartbeat that does not feel regular.  You get light-headed when getting up from sitting or lying down. These symptoms may be an emergency. Do not wait to see if the symptoms will go away. Get medical help right away. Call your local emergency services (911 in the [place] Do not drive yourself to the hospital. Summary  Iron deficiency anemia is when you have too little iron in your body.  This condition is treated by finding out why you do not have enough iron in your body and then getting more iron.  Take over-the-counter and prescription medicines only as told by your doctor.  Eat fresh fruits and vegetables that are high in vitamin C.  Get help right away if you cannot breathe well. This information is not intended to replace advice given to you by your health care provider. Make sure you discuss any questions you have with your health care provider. Document Revised: 02/03/2019 Document Reviewed: 02/03/2019 Elsevier Patient Education  2021 [employer].

## 2020-07-31 NOTE — MAU Note (Signed)
Pt reports to mau with concerns that she may be dehydrated.  Pt states she ran out of bottled water earlier this week and "didn't have time to get anymore until yesterday".  Pt reports she has since hydrated with 6-8 bottles of water.  Pt denies vomiting or diarrhea.  Denies any regular ctx or LOF at this time. +FM

## 2020-08-17 LAB — OB RESULTS CONSOLE GBS: GBS: NEGATIVE

## 2020-09-15 ENCOUNTER — Encounter (HOSPITAL_COMMUNITY): Payer: Self-pay | Admitting: Obstetrics and Gynecology

## 2020-09-15 ENCOUNTER — Other Ambulatory Visit: Payer: Self-pay

## 2020-09-15 ENCOUNTER — Inpatient Hospital Stay (HOSPITAL_COMMUNITY): Payer: Medicaid Other | Admitting: Anesthesiology

## 2020-09-15 ENCOUNTER — Inpatient Hospital Stay (HOSPITAL_COMMUNITY)
Admission: AD | Admit: 2020-09-15 | Discharge: 2020-09-16 | DRG: 807 | Disposition: A | Discharge status: Home / Self Care | Payer: Medicaid Other | Attending: Obstetrics & Gynecology | Admitting: Obstetrics & Gynecology

## 2020-09-15 DIAGNOSIS — Z3A40 40 weeks gestation of pregnancy: Secondary | ICD-10-CM

## 2020-09-15 DIAGNOSIS — Z20822 Contact with and (suspected) exposure to covid-19: Secondary | ICD-10-CM | POA: Diagnosis present

## 2020-09-15 DIAGNOSIS — O26893 Other specified pregnancy related conditions, third trimester: Secondary | ICD-10-CM | POA: Diagnosis present

## 2020-09-15 LAB — CBC
HCT: 32 % — ABNORMAL LOW (ref 36.0–46.0)
Hemoglobin: 10.2 g/dL — ABNORMAL LOW (ref 12.0–15.0)
MCH: 27.8 pg (ref 26.0–34.0)
MCHC: 31.9 g/dL (ref 30.0–36.0)
MCV: 87.2 fL (ref 80.0–100.0)
Platelets: 126 10*3/uL — ABNORMAL LOW (ref 150–400)
RBC: 3.67 MIL/uL — ABNORMAL LOW (ref 3.87–5.11)
RDW: 15.6 % — ABNORMAL HIGH (ref 11.5–15.5)
WBC: 7.3 10*3/uL (ref 4.0–10.5)
nRBC: 0 % (ref 0.0–0.2)

## 2020-09-15 LAB — RESP PANEL BY RT-PCR (FLU A&B, COVID) ARPGX2
Influenza A by PCR: NEGATIVE
Influenza B by PCR: NEGATIVE
SARS Coronavirus 2 by RT PCR: NEGATIVE

## 2020-09-15 LAB — TYPE AND SCREEN
ABO/RH(D): A POS
Antibody Screen: NEGATIVE

## 2020-09-15 LAB — POCT FERN TEST: POCT Fern Test: POSITIVE

## 2020-09-15 LAB — RPR: RPR Ser Ql: NONREACTIVE

## 2020-09-15 MED ORDER — COCONUT OIL OIL: 1.0000 "application " | TOPICAL_OIL | Status: DC | PRN

## 2020-09-15 MED ORDER — ACETAMINOPHEN 325 MG PO TABS: 650.0000 mg | ORAL_TABLET | ORAL | Status: DC | PRN

## 2020-09-15 MED ORDER — OXYCODONE HCL 5 MG PO TABS: 10.0000 mg | ORAL_TABLET | ORAL | Status: DC | PRN

## 2020-09-15 MED ORDER — LACTATED RINGERS IV SOLN: INTRAVENOUS | Status: DC

## 2020-09-15 MED ORDER — IBUPROFEN 600 MG PO TABS
600.0000 mg | ORAL_TABLET | Freq: Four times a day (QID) | ORAL | Status: DC
  Administered 2020-09-15 – 2020-09-16 (×6): 600 mg via ORAL
  Filled 2020-09-15 (×6): qty 1

## 2020-09-15 MED ORDER — OXYTOCIN BOLUS FROM INFUSION
333.0000 mL | Freq: Once | INTRAVENOUS | Status: AC
  Administered 2020-09-15: 333 mL via INTRAVENOUS

## 2020-09-15 MED ORDER — SENNOSIDES-DOCUSATE SODIUM 8.6-50 MG PO TABS
2.0000 | ORAL_TABLET | Freq: Every day | ORAL | Status: DC
  Administered 2020-09-16: 2 via ORAL
  Filled 2020-09-15: qty 2

## 2020-09-15 MED ORDER — ACETAMINOPHEN 325 MG PO TABS
650.0000 mg | ORAL_TABLET | ORAL | Status: DC | PRN
  Administered 2020-09-15 (×2): 650 mg via ORAL
  Filled 2020-09-15 (×2): qty 2

## 2020-09-15 MED ORDER — ONDANSETRON HCL 4 MG PO TABS: 4.0000 mg | ORAL_TABLET | ORAL | Status: DC | PRN

## 2020-09-15 MED ORDER — PRENATAL MULTIVITAMIN CH
1.0000 | ORAL_TABLET | Freq: Every day | ORAL | Status: DC
  Administered 2020-09-16: 1 via ORAL
  Filled 2020-09-15: qty 1

## 2020-09-15 MED ORDER — EPHEDRINE 5 MG/ML INJ: 10.0000 mg | INTRAVENOUS | Status: DC | PRN

## 2020-09-15 MED ORDER — LIDOCAINE HCL (PF) 1 % IJ SOLN
INTRAMUSCULAR | Status: DC | PRN
  Administered 2020-09-15 (×2): 5 mL via EPIDURAL

## 2020-09-15 MED ORDER — WITCH HAZEL-GLYCERIN EX PADS: 1.0000 "application " | MEDICATED_PAD | CUTANEOUS | Status: DC | PRN

## 2020-09-15 MED ORDER — PHENYLEPHRINE 40 MCG/ML (10ML) SYRINGE FOR IV PUSH (FOR BLOOD PRESSURE SUPPORT): 80.0000 ug | PREFILLED_SYRINGE | INTRAVENOUS | Status: DC | PRN

## 2020-09-15 MED ORDER — ONDANSETRON HCL 4 MG/2ML IJ SOLN: 4.0000 mg | Freq: Four times a day (QID) | INTRAMUSCULAR | Status: DC | PRN

## 2020-09-15 MED ORDER — DIPHENHYDRAMINE HCL 50 MG/ML IJ SOLN
12.5000 mg | INTRAMUSCULAR | Status: DC | PRN
Start: 2020-09-15 — End: 2020-09-15

## 2020-09-15 MED ORDER — SIMETHICONE 80 MG PO CHEW: 80.0000 mg | CHEWABLE_TABLET | ORAL | Status: DC | PRN

## 2020-09-15 MED ORDER — TETANUS-DIPHTH-ACELL PERTUSSIS 5-2.5-18.5 LF-MCG/0.5 IM SUSY: 0.5000 mL | PREFILLED_SYRINGE | Freq: Once | INTRAMUSCULAR | Status: DC

## 2020-09-15 MED ORDER — LACTATED RINGERS IV SOLN: 500.0000 mL | INTRAVENOUS | Status: DC | PRN

## 2020-09-15 MED ORDER — OXYCODONE-ACETAMINOPHEN 5-325 MG PO TABS
2.0000 | ORAL_TABLET | ORAL | Status: DC | PRN
Start: 2020-09-15 — End: 2020-09-15

## 2020-09-15 MED ORDER — DIBUCAINE (PERIANAL) 1 % EX OINT: 1.0000 "application " | TOPICAL_OINTMENT | CUTANEOUS | Status: DC | PRN

## 2020-09-15 MED ORDER — LACTATED RINGERS IV SOLN
500.0000 mL | Freq: Once | INTRAVENOUS | Status: AC
  Administered 2020-09-15: 500 mL via INTRAVENOUS

## 2020-09-15 MED ORDER — OXYCODONE-ACETAMINOPHEN 5-325 MG PO TABS: 1.0000 | ORAL_TABLET | ORAL | Status: DC | PRN

## 2020-09-15 MED ORDER — OXYCODONE HCL 5 MG PO TABS
5.0000 mg | ORAL_TABLET | ORAL | Status: DC | PRN
  Administered 2020-09-15 – 2020-09-16 (×2): 5 mg via ORAL
  Filled 2020-09-15 (×2): qty 1

## 2020-09-15 MED ORDER — LIDOCAINE HCL (PF) 1 % IJ SOLN: 30.0000 mL | INTRAMUSCULAR | Status: DC | PRN

## 2020-09-15 MED ORDER — ONDANSETRON HCL 4 MG/2ML IJ SOLN: 4.0000 mg | INTRAMUSCULAR | Status: DC | PRN

## 2020-09-15 MED ORDER — BENZOCAINE-MENTHOL 20-0.5 % EX AERO
1.0000 "application " | INHALATION_SPRAY | CUTANEOUS | Status: DC | PRN
  Administered 2020-09-15: 1 via TOPICAL
  Filled 2020-09-15: qty 56

## 2020-09-15 MED ORDER — OXYTOCIN-SODIUM CHLORIDE 30-0.9 UT/500ML-% IV SOLN
2.5000 [IU]/h | INTRAVENOUS | Status: DC
  Filled 2020-09-15: qty 500

## 2020-09-15 MED ORDER — OXYTOCIN-SODIUM CHLORIDE 30-0.9 UT/500ML-% IV SOLN
1.0000 m[IU]/min | INTRAVENOUS | Status: DC
  Administered 2020-09-15: 2 m[IU]/min via INTRAVENOUS

## 2020-09-15 MED ORDER — DIPHENHYDRAMINE HCL 25 MG PO CAPS: 25.0000 mg | ORAL_CAPSULE | Freq: Four times a day (QID) | ORAL | Status: DC | PRN

## 2020-09-15 MED ORDER — TERBUTALINE SULFATE 1 MG/ML IJ SOLN: 0.2500 mg | Freq: Once | INTRAMUSCULAR | Status: DC | PRN

## 2020-09-15 MED ORDER — FENTANYL-BUPIVACAINE-NACL 0.5-0.125-0.9 MG/250ML-% EP SOLN
12.0000 mL/h | EPIDURAL | Status: DC | PRN
  Administered 2020-09-15: 12 mL/h via EPIDURAL
  Filled 2020-09-15: qty 250

## 2020-09-15 MED ORDER — ZOLPIDEM TARTRATE 5 MG PO TABS: 5.0000 mg | ORAL_TABLET | Freq: Every evening | ORAL | Status: DC | PRN

## 2020-09-15 MED ORDER — SOD CITRATE-CITRIC ACID 500-334 MG/5ML PO SOLN: 30.0000 mL | ORAL | Status: DC | PRN

## 2020-09-15 NOTE — Lactation Note (Signed)
This note was copied from a baby's chart. Lactation Consultation Note  Patient Name: Christy Moran Date: 09/15/2020 Reason for consult: Initial assessment;1st time breastfeeding;Term Age:22 hours  Initial visit to 12 hours old infant of a P2 mother. Mother is breastfeeding for the first time. FOI is holding infant upon arrival.  LC noted stool and changed infant's diaper. Talked to mother about hand expression and demonstrated technique. Collected ~32mL of colostrum in a spoon. LC spoonfed infant.  Set up support pillows for football position to right breast. Demonstrated alignment. Several attempts but infant did not latch. Attempted cradle position to left nipple, but unable to latch.  Tried football position and infant latched. Noted suckling rhythmically with flanged lips, observed swallowing and breast tissue moving. Mother denies pain or discomfort. Still nursing upon [name] leaving room.    Plan: 1-Skin to skin 2-Aim for a deep, comfortable latch 3-Breastfeeding on demand or 8-12 times in 24h period. 4-Keep infant awake during breastfeeding session: massaging breast, infant's hand/shoulder/feet 5-Monitor voids and stools as signs good intake.  6-Encouraged maternal rest, hydration and food intake.  7-Contact LC as needed for feeds/support/concerns/questions   All questions answered at this time. Provided Lactation services brochure and promoted [employer] booklet information.    Maternal Data Has patient been taught Hand Expression?: Yes Does the patient have breastfeeding experience prior to this delivery?: No How long did the patient breastfeed?: did not breastfeed her first child  Feeding Mother's Current Feeding Choice: Breast Milk and Formula  LATCH Score Latch: Repeated attempts needed to sustain latch, nipple held in mouth throughout feeding, stimulation needed to elicit sucking reflex.  Audible Swallowing: A few with stimulation  Type of Nipple: Flat (pliable  tissue)  Comfort (Breast/Nipple): Soft / non-tender  Hold (Positioning): Assistance needed to correctly position infant at breast and maintain latch.  LATCH Score: 6  Interventions Interventions: Breast feeding basics reviewed;Assisted with latch;Skin to skin;Breast massage;Hand express;Adjust position;Hand pump;Expressed milk;Position options;Support pillows;Education  Discharge Pump: Manual WIC Program: Yes  Consult Status Consult Status: Follow-up Date: 09/16/20 Follow-up type: In-patient    Christy Moran 09/15/2020, 10:19 PM

## 2020-09-15 NOTE — H&P (Signed)
Christy Moran is a 20 y.o. female, G3P1011, IUP at 40.6 weeks, presenting for SROM meconium at 0525am 4/7 with cxt continue since 0300 in active labor at 6.5cm. GBS-. LR Female. LPC at 17 weeks. ANemai with hgb at 10 at 28 weeks, PO Iron.  Pt endorse + Fm. Denies vaginal bleeding.  Patient Active Problem List   Diagnosis Date Noted  . Normal labor 07/17/2018  . Encounter for supervision of normal first pregnancy in first trimester 02/14/2018     Medications Prior to Admission  Medication Sig Dispense Refill Last Dose  . ferrous sulfate 325 (65 FE) MG tablet Take 325 mg by mouth daily with breakfast.   09/14/2020 at Unknown time  . Prenatal Vit-Fe Fumarate-FA (PRENATAL MULTIVITAMIN) TABS tablet Take 1 tablet by mouth daily at 12 noon.   09/14/2020 at Unknown time  . docusate sodium (COLACE) 100 MG capsule Take 1 capsule (100 mg total) by mouth 2 (two) times daily. 60 capsule 0   . Doxylamine-Pyridoxine (DICLEGIS) 10-10 MG TBEC Take 1 tablet by mouth in the morning and at bedtime. 60 tablet 1     Past Medical History:  Diagnosis Date  . Anemia   . Hx of gonorrhea      No current facility-administered medications on file prior to encounter.   Current Outpatient Medications on File Prior to Encounter  Medication Sig Dispense Refill  . ferrous sulfate 325 (65 FE) MG tablet Take 325 mg by mouth daily with breakfast.    . Prenatal Vit-Fe Fumarate-FA (PRENATAL MULTIVITAMIN) TABS tablet Take 1 tablet by mouth daily at 12 noon.    . docusate sodium (COLACE) 100 MG capsule Take 1 capsule (100 mg total) by mouth 2 (two) times daily. 60 capsule 0  . Doxylamine-Pyridoxine (DICLEGIS) 10-10 MG TBEC Take 1 tablet by mouth in the morning and at bedtime. 60 tablet 1     No Known Allergies  History of present pregnancy: Pt Info/Preference:  Screening/Consents:  Labs:   EDD: Estimated Date of Delivery: 09/09/20  Establised: Patient's last menstrual period was 06/18/2019 (approximate).  Anatomy Scan: Date:  12/6 Placenta Location: complete wnl, posterior Genetic Screen: Panoroma:LR female AFP: WNL First Tri: Quad: Horizon: neg  Office: [facility]             First PNV: 16.6 wg Blood Type  A+  Language: english Last PNV: 39.3 wg Rhogam    Flu Vaccine:  declined   Antibody  Neg  TDaP vaccine UDT   GTT: Early: 5.7 Third Trimester: 87  Feeding Plan: Breast/bottle BTL: no Rubella:  Immune  Contraception: ??? VBAC: no RPR:   NR  Circumcision: In pt desired   HBsAg:  Neg  Pediatrician:  ???   HIV:   Neg  Prenatal Classes: no Additional [place]: None GBS: Negative/-- (03/09 0000)(For PCN allergy, check sensitivities)       Chlamydia: neg    MFM Referral/Consult:  GC: neg  Support Person: parnter   PAP: ???  Pain Management: Epidural desired Neonatologist Referral:  Hgb Electrophoresis:  AA  Birth Plan: DCC   Hgb NOB: 11    28W: 10   OB History    Gravida  3   Para  1   Term  1   Preterm      AB  1   Living  1     SAB      IAB  1   Ectopic      Multiple      Live  Births  1          Past Medical History:  Diagnosis Date  . Anemia   . Hx of gonorrhea    Past Surgical History:  Procedure Laterality Date  . INDUCED ABORTION    . NO PAST SURGERIES     Family History: family history includes Cancer in her maternal grandfather and paternal grandfather; Hypertension in her father and mother; Stroke in her maternal uncle. Social History:  reports that she has never smoked. She has never used smokeless tobacco. She reports previous alcohol use. She reports that she does not use drugs.   Prenatal Transfer Tool  Maternal Diabetes: No Genetic Screening: Normal Maternal Ultrasounds/Referrals: Normal Fetal Ultrasounds or other Referrals:  None Maternal Substance Abuse:  No Significant Maternal Medications:  None Significant Maternal Lab Results: Group B Strep negative  ROS:  Review of Systems  Constitutional: Negative.   HENT: Negative.   Eyes: Negative.   Respiratory: Negative.    Cardiovascular: Negative.   Gastrointestinal: Positive for abdominal pain.  Genitourinary:       Leakage of green fluid   Musculoskeletal: Negative.   Skin: Negative.   Neurological: Negative.   Endo/Heme/Allergies: Negative.   Psychiatric/Behavioral: Negative.      Physical Exam: LMP 06/18/2019 (Approximate)   Physical Exam   NST: FHR baseline 130 bpm, Variability: moderate, Accelerations:present, Decelerations:  Absent= Cat 1/Reactive UC:   regular, every 3 minutes SVE:   Dilation: 6.5 Effacement (%): 70 Station: -1 Exam by:: [facility], CNM, vertex verified by fetal sutures.  Leopold's: Position vertex, EFW 6.5lbs.  via leopold's.   Labs: No results found for this or any previous visit (from the past 24 hour(s)).  Imaging:  No results found.  MAU Course: Orders Placed This Encounter  Procedures  . OB RESULT CONSOLE Group B Strep  . Contraction - monitoring  . External fetal heart monitoring  . POCT fern test   No orders of the defined types were placed in this encounter.   Assessment/Plan: Christy Moran is a 20 y.o. female, G3P1011, IUP at 40.6 weeks, presenting for SROM meconium at 0525am 4/7 with cxt continue since 0300 in active labor at 6.5cm. GBS-. LR Female. LPC at 17 weeks. ANemai with hgb at 10 at 28 weeks, PO Iron.  Pt endorse + Fm. Denies vaginal bleeding.  FWB: Cat 1 Fetal Tracing.   Plan: Admit to [facility]  Routine [facility] orders Pain med/epidural prn Expectant management.  Anticipate labor progression   Dale Pasco NP-C, CNM, MSN 09/15/2020, 5:47 AM

## 2020-09-15 NOTE — Anesthesia Preprocedure Evaluation (Signed)
Anesthesia Evaluation  Patient identified by MRN, date of birth, ID band Patient awake    Reviewed: Allergy & Precautions, NPO status , Patient's Chart, lab work & pertinent test results  History of Anesthesia Complications Negative for: history of anesthetic complications  Airway Mallampati: II  TM Distance: >3 FB Neck ROM: Full    Dental no notable dental hx. (+) Dental Advisory Given   Pulmonary neg pulmonary ROS,    Pulmonary exam normal        Cardiovascular negative cardio ROS Normal cardiovascular exam     Neuro/Psych negative neurological ROS     GI/Hepatic negative GI ROS, Neg liver ROS,   Endo/Other  negative endocrine ROS  Renal/GU negative Renal ROS     Musculoskeletal negative musculoskeletal ROS (+)   Abdominal   Peds  Hematology negative hematology ROS (+)   Anesthesia Other Findings   Reproductive/Obstetrics (+) Pregnancy                             Anesthesia Physical Anesthesia Plan  ASA: II  Anesthesia Plan: Epidural   Post-op Pain Management:    Induction:   PONV Risk Score and Plan:   Airway Management Planned: Natural Airway  Additional Equipment:   Intra-op Plan:   Post-operative Plan:   Informed Consent: I have reviewed the patients History and Physical, chart, labs and discussed the procedure including the risks, benefits and alternatives for the proposed anesthesia with the patient or authorized representative who has indicated his/her understanding and acceptance.     Dental advisory given  Plan Discussed with: Anesthesiologist  Anesthesia Plan Comments:         Anesthesia Quick Evaluation

## 2020-09-15 NOTE — MAU Note (Signed)
..  Christy Moran is a 20 y.o. at [redacted]w[redacted]d here in MAU reporting: SROM at 0525 greenish fluid. +FM.

## 2020-09-15 NOTE — Anesthesia Procedure Notes (Signed)
Epidural Patient location during procedure: OB Start time: 09/15/2020 6:38 AM End time: 09/15/2020 6:49 AM  Staffing Anesthesiologist: Heather Roberts, MD Performed: anesthesiologist   Preanesthetic Checklist Completed: patient identified, IV checked, site marked, risks and benefits discussed, monitors and equipment checked, pre-op evaluation and timeout performed  Epidural Patient position: sitting Prep: DuraPrep Patient monitoring: heart rate, cardiac monitor, continuous pulse ox and blood pressure Approach: midline Location: L2-L3 Injection technique: LOR saline  Needle:  Needle type: Tuohy  Needle gauge: 17 G Needle length: 9 cm Needle insertion depth: 7 cm Catheter size: 20 Guage Catheter at skin depth: 12 cm Test dose: negative and Other  Assessment Events: blood not aspirated, injection not painful, no injection resistance and negative IV test  Additional Notes Informed consent obtained prior to proceeding including risk of failure, 1% risk of PDPH, risk of minor discomfort and bruising.  Discussed rare but serious complications including epidural abscess, permanent nerve injury, epidural hematoma.  Discussed alternatives to epidural analgesia and patient desires to proceed.  Timeout performed pre-procedure verifying patient name, procedure, and platelet count.  Patient tolerated procedure well.

## 2020-09-15 NOTE — Progress Notes (Signed)
ADJA RUFF is a 20 y.o. G3P1011 at [redacted]w[redacted]d  admitted for active labor and SROM @ 0545.  Subjective:  Letishia is resting comfortably with epidural. SVE 7.5/80/-1. Ctx have spaced since epidural placement. Discussed pitocin with pt. Pt states this happened with her last pregnancy also and would like to start pitocin now. CAT 1.   Objective: Vitals:   09/15/20 0711 09/15/20 0722 09/15/20 0730 09/15/20 0830  BP: 126/82  124/80 122/61  Pulse: 74  75 78  Resp:   18   Temp:  98.8 F (37.1 C)    TempSrc:  Oral    SpO2:      Weight:      Height:        No intake/output data recorded.    FHT:  FHR: 115 bpm, variability: moderate,  accelerations:  Present,  decelerations:  Absent UC:   regular, every 3-7 minutes SVE:   Dilation: 7.5 Effacement (%): 80 Station: -1 Exam by:: [employer] CNM  Labs:   Recent Labs    09/15/20 0554  WBC 7.3  HGB 10.2*  HCT 32.0*  PLT 126*    Assessment / Plan: 20 y.o. AGP@ [redacted]w[redacted]d admitted for active labor with SROM at 0545, meconium stained fluid. Ctx have spaced. Will start pitocin 2x2.   Labor: Active phase. Ctx spaced. Pitocin started 2x2. Preeclampsia:  no signs or symptoms of toxicity Fetal Wellbeing:  Category I Pain Control:  Epidural I/D:  GBS negative Anticipated MOD:  NSVD   Dr Sallye Ober updated on pt status and POC.   [facility] MSN, CNM 09/15/2020, 9:07 AM

## 2020-09-16 LAB — CBC
HCT: 27.5 % — ABNORMAL LOW (ref 36.0–46.0)
Hemoglobin: 8.8 g/dL — ABNORMAL LOW (ref 12.0–15.0)
MCH: 27.8 pg (ref 26.0–34.0)
MCHC: 32 g/dL (ref 30.0–36.0)
MCV: 87 fL (ref 80.0–100.0)
Platelets: 111 10*3/uL — ABNORMAL LOW (ref 150–400)
RBC: 3.16 MIL/uL — ABNORMAL LOW (ref 3.87–5.11)
RDW: 15.4 % (ref 11.5–15.5)
WBC: 8.5 10*3/uL (ref 4.0–10.5)
nRBC: 0 % (ref 0.0–0.2)

## 2020-09-16 MED ORDER — ACETAMINOPHEN 325 MG PO TABS: 650.0000 mg | ORAL_TABLET | ORAL | 1 refills | Status: DC | PRN

## 2020-09-16 MED ORDER — IBUPROFEN 600 MG PO TABS: 600.0000 mg | ORAL_TABLET | Freq: Four times a day (QID) | ORAL | 0 refills | Status: DC

## 2020-09-16 NOTE — Progress Notes (Signed)
Post Partum Day 1 Subjective: no complaints, up ad lib, voiding, tolerating PO and + flatus  Objective: Blood pressure 122/60, pulse 70, temperature 98.2 F (36.8 C), temperature source Oral, resp. rate 16, height 5\' 4"  (1.626 m), weight 78.9 kg, last menstrual period 06/18/2019, SpO2 100 %, unknown if currently breastfeeding.  Physical Exam:  General: alert, cooperative, appears older than stated age and no distress Lochia: appropriate Uterine Fundus: firm Incision: na DVT Evaluation: No evidence of DVT seen on physical exam. Negative Homan's sign. No cords or calf tenderness. No significant calf/ankle edema.  Recent Labs    09/15/20 0554  HGB 10.2*  HCT 32.0*    Assessment/Plan: Circumcision prior to discharge Pt feels well enough for DC this evening, however wants to ensure inpatient circ can be done today and that she feels comfortable with home care.  Pending PM DC if goals accomplished  LOS: 1 day   11/15/20 Christy Moran 09/16/2020, 8:27 AM

## 2020-09-16 NOTE — Anesthesia Postprocedure Evaluation (Signed)
Anesthesia Post Note  Patient: Christy Moran  Procedure(s) Performed: AN AD HOC LABOR EPIDURAL     Patient location during evaluation: Mother Baby Anesthesia Type: Epidural Level of consciousness: awake and alert Pain management: pain level controlled Vital Signs Assessment: post-procedure vital signs reviewed and stable Respiratory status: spontaneous breathing, nonlabored ventilation and respiratory function stable Cardiovascular status: stable Postop Assessment: no headache, no backache and epidural receding Anesthetic complications: no   No complications documented.  Last Vitals:  Vitals:   09/16/20 0050 09/16/20 0514  BP: 114/69 122/60  Pulse: 66 70  Resp: 17 16  Temp: 36.6 C 36.8 C  SpO2: 100% 100%    Last Pain:  Vitals:   09/16/20 0514  TempSrc: Oral  PainSc: 2    Pain Goal: Patients Stated Pain Goal: 2 (09/15/20 1841)                 [facility] Records

## 2020-09-16 NOTE — Anesthesia Postprocedure Evaluation (Signed)
Anesthesia Post Note  Patient: Christy Moran  Procedure(s) Performed: AN AD HOC LABOR EPIDURAL     Patient location during evaluation: Mother Baby Anesthesia Type: Epidural Level of consciousness: awake and alert and oriented Pain management: satisfactory to patient Vital Signs Assessment: post-procedure vital signs reviewed and stable Respiratory status: respiratory function stable Cardiovascular status: stable Postop Assessment: no headache, no backache, epidural receding, patient able to bend at knees, no signs of nausea or vomiting, adequate PO intake and able to ambulate Anesthetic complications: no   No complications documented.  Last Vitals:  Vitals:   09/16/20 0050 09/16/20 0514  BP: 114/69 122/60  Pulse: 66 70  Resp: 17 16  Temp: 36.6 C 36.8 C  SpO2: 100% 100%    Last Pain:  Vitals:   09/16/20 0514  TempSrc: Oral  PainSc: 2    Pain Goal: Patients Stated Pain Goal: 2 (09/15/20 1841)              Epidural/Spinal Function Cutaneous sensation: Normal sensation (09/16/20 0845), Patient able to flex knees: Yes (09/16/20 0845), Patient able to lift hips off bed: Yes (09/16/20 0845), Back pain beyond tenderness at insertion site: No (09/16/20 0845), Progressively worsening motor and/or sensory loss: No (09/16/20 0845), Bowel and/or bladder incontinence post epidural: No (09/16/20 0845)  [name]

## 2020-09-16 NOTE — Lactation Note (Signed)
This note was copied from a baby's chart. Lactation Consultation Note  Patient Name: Christy Moran Date: 09/16/2020   Age:21 hours  Mom given her pump loaner with instructions to return it by 09/28/2020. LC reviewed breastfeeding supplementation guide based on hrs of age after birth.   Plan 1. Feed based on cues 8-12x in 24 hr period no more than 4 hrs without an attempt. Mom to offer both breasts, STS and look for swallows.           2. Mom to pace bottle feed with her Dr. Manson Passey breastfeeding nipple and bottle of EBM./ Formula. Mom expressed interest in using formula as well.            3. Mom to pump using DEBP q 3hrs for 15 minutes  LC reviewed positions using her breastfeeding guide book.   All questions answered at the end of the visit.   Maternal Data    Feeding    LATCH Score                    Lactation Tools Discussed/Used    Interventions    Discharge    Consult Status      Christy Braid  Moran 09/16/2020, 5:45 PM

## 2020-09-16 NOTE — Anesthesia Postprocedure Evaluation (Signed)
Anesthesia Post Note  Patient: Christy Moran  Procedure(s) Performed: AN AD HOC LABOR EPIDURAL     Anesthesia Post Evaluation No complications documented.  Last Vitals:  Vitals:   09/16/20 0050 09/16/20 0514  BP: 114/69 122/60  Pulse: 66 70  Resp: 17 16  Temp: 36.6 C 36.8 C  SpO2: 100% 100%    Last Pain:  Vitals:   09/16/20 0514  TempSrc: Oral  PainSc: 2    Pain Goal: Patients Stated Pain Goal: 2 (09/15/20 1841)                 [facility]ica Records

## 2020-09-16 NOTE — Discharge Summary (Signed)
Postpartum Discharge Summary      Patient Name: Christy Moran DOB: 01-Apr-2001 MRN: [id number]  Date of admission: 09/15/2020 Delivery date:09/15/2020  Delivering provider: Holli Humbles B  Date of discharge: 09/16/2020  Admitting diagnosis: Normal labor and delivery [O80] Intrauterine pregnancy: [redacted]w[redacted]d     Secondary diagnosis:  Active Problems:   Normal labor and delivery  Additional problems: NA    Discharge diagnosis: Term Pregnancy Delivered                                              Post partum procedures:na Augmentation: Pitocin Complications: None  Hospital course: Onset of Labor With Vaginal Delivery      20 y.o. yo [id number] at [redacted]w[redacted]d was admitted in Active Labor on 09/15/2020. Patient had an uncomplicated labor course as follows:  Membrane Rupture Time/Date: 5:25 AM ,09/15/2020   Delivery Method:Vaginal, Spontaneous  Episiotomy: None  Lacerations:  None  Patient had an uncomplicated postpartum course.  She is ambulating, tolerating a regular diet, passing flatus, and urinating well. Patient is discharged home in stable condition on 09/16/20.  Newborn Data: Birth date:09/15/2020  Birth time:9:49 AM  Gender:Female  Living status:Living  Apgars:8 ,9  Weight:3861 g   Magnesium Sulfate received: No BMZ received: No Rhophylac:N/A MMR:N/A T-DaP:Given prenatally Flu: N/A Transfusion:No  Physical exam  Vitals:   09/15/20 1718 09/15/20 2016 09/16/20 0050 09/16/20 0514  BP: 118/67 131/83 114/69 122/60  Pulse: 72 73 66 70  Resp: $Remo'16 18 17 16  'RdTId$ Temp: 98.9 F (37.2 C) 98.2 F (36.8 C) 97.9 F (36.6 C) 98.2 F (36.8 C)  TempSrc: Oral Oral Oral Oral  SpO2: 99% 100% 100% 100%  Weight:      Height:       General: alert, cooperative and no distress Lochia: appropriate Uterine Fundus: firm Incision: N/A DVT Evaluation: No evidence of DVT seen on physical exam. Negative Homan's sign. No cords or calf tenderness. No significant calf/ankle edema. Labs: Lab Results   Component Value Date   WBC 8.5 09/16/2020   HGB 8.8 (L) 09/16/2020   HCT 27.5 (L) 09/16/2020   MCV 87.0 09/16/2020   PLT 111 (L) 09/16/2020   CMP Latest Ref Rng & Units 07/31/2020  Glucose 70 - 99 mg/dL 84  BUN 6 - 20 mg/dL 8  Creatinine 0.44 - 1.00 mg/dL 0.70  Sodium 135 - 145 mmol/L 133(L)  Potassium 3.5 - 5.1 mmol/L 4.0  Chloride 98 - 111 mmol/L 102  CO2 22 - 32 mmol/L 24  Calcium 8.9 - 10.3 mg/dL 8.3(L)  Total Protein 6.5 - 8.1 g/dL 5.5(L)  Total Bilirubin 0.3 - 1.2 mg/dL 0.1(L)  Alkaline Phos 38 - 126 U/L 87  AST 15 - 41 U/L 14(L)  ALT 0 - 44 U/L 11   Edinburgh Score: Edinburgh Postnatal Depression Scale Screening Tool 09/16/2020  I have been able to laugh and see the funny side of things. 0  I have looked forward with enjoyment to things. 0  I have blamed myself unnecessarily when things went wrong. 1  I have been anxious or worried for no good reason. 1  I have felt scared or panicky for no good reason. 0  Things have been getting on top of me. 1  I have been so unhappy that I have had difficulty sleeping. 0  I have felt sad or miserable. 0  I have been so unhappy that I have been crying. 0  The thought of harming myself has occurred to me. 0  Edinburgh Postnatal Depression Scale Total 3      After visit meds:  Allergies as of 09/16/2020   No Known Allergies     Medication List    STOP taking these medications   ferrous sulfate 325 (65 FE) MG tablet   prenatal multivitamin Tabs tablet   PROBIOTIC PO     TAKE these medications   acetaminophen 325 MG tablet Commonly known as: Tylenol Take 2 tablets (650 mg total) by mouth every 4 (four) hours as needed (for pain scale < 4). What changed:   medication strength  how much to take  when to take this  reasons to take this   ibuprofen 600 MG tablet Commonly known as: ADVIL Take 1 tablet (600 mg total) by mouth every 6 (six) hours.        Discharge home in stable condition Infant Feeding: Bottle  and Breast Infant Disposition:home with mother Discharge instruction: per After Visit Summary and Postpartum booklet. Activity: Advance as tolerated. Pelvic rest for 6 weeks.  Diet: routine diet Anticipated Birth Control: Condoms and Unsure Postpartum Appointment:6 weeks Additional Postpartum F/U: na Future Appointments:No future appointments. Follow up Visit:  Follow-up Information    [facility] [place] Follow up in 6 week(s).   Specialty: Obstetrics and Gynecology Contact information: [address] [place] [address] [phone]                   09/16/2020 Ike Bene, CNM

## 2020-09-16 NOTE — Lactation Note (Signed)
This note was copied from a baby's chart. Lactation Consultation Note  Patient Name: Christy Moran [id number] Date: 09/16/2020 Reason for consult: Follow-up assessment Age:22 hours  P2 mother whose infant is now 69 hours old.  This is a term baby at 40+6 weeks.  Mother had no questions/concerns related to breast feeding.  She feels like her son is latching and feeding well.  She continues to practice hand expression and is feeding back any EBM she obtains to baby.  Her nipples are intact.  Suggested she use EBM and coconut oil for nipple comfort.  Provided coconut oil with instructions for use.  Mother interested in obtaining a DEBP after discharge.  Discussed options including WIC and the [facility] loaner pump program.  She plans to call the office this morning when they open to determine eligibility.    Engorgement prevention/treatment discussed.  Mother has a manual pump at bedside.  Father present.  Mother also has our OP phone number for any further questions/concerns.   Maternal Data    Feeding    LATCH Score                    Lactation Tools Discussed/Used    Interventions Interventions: Education  Discharge Discharge Education: Engorgement and breast care  Consult Status Consult Status: Complete Date: 09/16/20 Follow-up type: Call as needed    Christy Moran R Omkar Stratmann 09/16/2020, 8:32 AM

## 2020-09-22 NOTE — H&P (Signed)
Christy Moran is a 20 y.o. female, G3P1011, IUP at 40.6 weeks, presenting for SROM meconium at 0525am 4/7 with cxt continue since 0300 in active labor at 6.5cm. GBS-. LR Female. LPC at 17 weeks. ANemai with hgb at 10 at 28 weeks, PO Iron.  Pt endorse + Fm. Denies vaginal bleeding.  Patient Active Problem List   Diagnosis Date Noted  . Normal labor and delivery 09/15/2020  . Normal labor 07/17/2018  . Encounter for supervision of normal first pregnancy in first trimester 02/14/2018     No medications prior to admission.    Past Medical History:  Diagnosis Date  . Anemia   . Hx of gonorrhea      No current facility-administered medications on file prior to encounter.   No current outpatient medications on file prior to encounter.     No Known Allergies  History of present pregnancy: Pt Info/Preference:  Screening/Consents:  Labs:   EDD: Estimated Date of Delivery: 09/09/20  Establised: Patient's last menstrual period was 06/18/2019 (approximate).  Anatomy Scan: Date: 12/6 Placenta Location: complete wnl, posterior Genetic Screen: Panoroma:LR female AFP: WNL First Tri: Quad: Horizon: neg  Office: [facility]             First PNV: 16.6 wg Blood Type --/--/A POS (04/07 0548)A+  Language: english Last PNV: 39.3 wg Rhogam    Flu Vaccine:  declined   Antibody NEG (04/07 0548)Neg  TDaP vaccine UDT   GTT: Early: 5.7 Third Trimester: 87  Feeding Plan: Breast/bottle BTL: no Rubella: Immune (10/22 0000)Immune  Contraception: ??? VBAC: no RPR: NON REACTIVE (04/07 0554) NR  Circumcision: In pt desired   HBsAg: Negative (10/22 0000)Neg  Pediatrician:  ???   HIV: Non-reactive (10/22 0000) Neg  Prenatal Classes: no Additional [place]: None GBS: Negative/-- (03/09 0000)(For PCN allergy, check sensitivities)       Chlamydia: neg    MFM Referral/Consult:  GC: neg  Support Person: parnter   PAP: ???  Pain Management: Epidural desired Neonatologist Referral:  Hgb Electrophoresis:  AA  Birth Plan: DCC    Hgb NOB: 11    28W: 10   OB History    Gravida  3   Para  2   Term  2   Preterm      AB  1   Living  2     SAB      IAB  1   Ectopic      Multiple  0   Live Births  2          Past Medical History:  Diagnosis Date  . Anemia   . Hx of gonorrhea    Past Surgical History:  Procedure Laterality Date  . INDUCED ABORTION    . NO PAST SURGERIES     Family History: family history includes Cancer in her maternal grandfather and paternal grandfather; Hypertension in her father and mother; Stroke in her maternal uncle. Social History:  reports that she has never smoked. She has never used smokeless tobacco. She reports previous alcohol use. She reports that she does not use drugs.   Prenatal Transfer Tool  Maternal Diabetes: No Genetic Screening: Normal Maternal Ultrasounds/Referrals: Normal Fetal Ultrasounds or other Referrals:  None Maternal Substance Abuse:  No Significant Maternal Medications:  None Significant Maternal Lab Results: Group B Strep negative  ROS:  Review of Systems  Constitutional: Negative.   HENT: Negative.   Eyes: Negative.   Respiratory: Negative.   Cardiovascular: Negative.   Gastrointestinal:  Positive for abdominal pain.  Genitourinary:       Leakage of green fluid   Musculoskeletal: Negative.   Skin: Negative.   Neurological: Negative.   Endo/Heme/Allergies: Negative.   Psychiatric/Behavioral: Negative.      Physical Exam: BP 112/70   Pulse 78   Temp (!) 97.3 F (36.3 C) (Axillary)   Resp 18   Ht 5\' 4"  (1.626 m)   Wt 78.9 kg   LMP 06/18/2019 (Approximate)   SpO2 100%   Breastfeeding Unknown   BMI 29.87 kg/m   Physical Exam Vitals and nursing note reviewed. Exam conducted with a chaperone present.  Constitutional:      Appearance: Normal appearance.  HENT:     Head: Normocephalic and atraumatic.     Nose: Nose normal.     Mouth/Throat:     Mouth: Mucous membranes are moist.  Eyes:     Conjunctiva/sclera:  Conjunctivae normal.  Cardiovascular:     Rate and Rhythm: Normal rate and regular rhythm.     Pulses: Normal pulses.     Heart sounds: Normal heart sounds.  Pulmonary:     Effort: Pulmonary effort is normal.     Breath sounds: Normal breath sounds.  Abdominal:     General: Bowel sounds are normal.  Genitourinary:    Comments: Pelvis adequate for vaginal delivery  Musculoskeletal:        General: Normal range of motion.     Cervical back: Normal range of motion and neck supple.  Skin:    General: Skin is warm.     Capillary Refill: Capillary refill takes less than 2 seconds.  Neurological:     General: No focal deficit present.     Mental Status: She is alert.  Psychiatric:        Mood and Affect: Mood normal.      NST: FHR baseline 130 bpm, Variability: moderate, Accelerations:present, Decelerations:  Absent= Cat 1/Reactive UC:   regular, every 3 minutes SVE:   Dilation: 10 Effacement (%): 100 Station: Plus 2 Exam by:: [email or web address] Clemons RN, vertex verified by fetal sutures.  Leopold's: Position vertex, EFW 6.5lbs.  via leopold's.   Labs: No results found for this or any previous visit (from the past 24 hour(s)).  Imaging:  No results found.  MAU Course: Orders Placed This Encounter  Procedures  . OB RESULT CONSOLE Group B Strep  . Resp Panel by RT-PCR (Flu A&B, Covid) Nasopharyngeal Swab  . CBC  . RPR  . OB RESULTS CONSOLE GC/Chlamydia  . OB RESULTS CONSOLE HIV antibody  . OB RESULTS CONSOLE Rubella Antibody  . OB RESULTS CONSOLE Hepatitis B surface antigen  . CBC  . Diet - low sodium heart healthy  . Patient has an active order for admit to inpatient/place in observation  . Call MD for:  extreme fatigue  . Call MD for:  persistant dizziness or light-headedness  . Call MD for:  hives  . Call MD for:  difficulty breathing, headache or visual disturbances  . Call MD for:  redness, tenderness, or signs of infection (pain, swelling, redness, odor or green/yellow  discharge around incision site)  . Call MD for:  severe uncontrolled pain  . Call MD for:  persistant nausea and vomiting  . Call MD for:  temperature >100.4  . Activity as tolerated  . POCT fern test  . Type and screen [facility]  . Admit to Inpatient (patient's expected length of stay will be greater than 2 midnights  or inpatient only procedure)  . Transfer patient  . Discharge patient Discharge disposition: 01-Home or Self Care; Discharge patient date: 09/16/2020   Meds ordered this encounter  Medications  . DISCONTD: lactated ringers infusion  . oxytocin (PITOCIN) IV BOLUS FROM BAG  . DISCONTD: oxytocin (PITOCIN) IV infusion 30 units in NS 500 mL - Premix  . DISCONTD: lactated ringers infusion 500-1,000 mL  . DISCONTD: acetaminophen (TYLENOL) tablet 650 mg  . DISCONTD: oxyCODONE-acetaminophen (PERCOCET/ROXICET) 5-325 MG per tablet 1 tablet  . DISCONTD: oxyCODONE-acetaminophen (PERCOCET/ROXICET) 5-325 MG per tablet 2 tablet  . DISCONTD: ondansetron (ZOFRAN) injection 4 mg  . DISCONTD: sodium citrate-citric acid (ORACIT) solution 30 mL  . DISCONTD: lidocaine (PF) (XYLOCAINE) 1 % injection 30 mL  . DISCONTD: ePHEDrine injection 10 mg  . DISCONTD: PHENYLephrine 40 mcg/ml in normal saline Adult IV Push Syringe (For Blood Pressure Support)  . lactated ringers infusion 500 mL  . DISCONTD: fentaNYL 2 mcg/mL w/ bupivacaine 0.125% in NS 250 mL epidural infusion (WCC-ANES)  . DISCONTD: diphenhydrAMINE (BENADRYL) injection 12.5 mg  . DISCONTD: ePHEDrine injection 10 mg  . DISCONTD: PHENYLephrine 40 mcg/ml in normal saline Adult IV Push Syringe (For Blood Pressure Support)  . DISCONTD: terbutaline (BRETHINE) injection 0.25 mg  . DISCONTD: oxytocin (PITOCIN) IV infusion 30 units in NS 500 mL - Premix    Order Specific Question:   Begin infusion at:    Answer:   2 milli-units/min (2 mL/hr)    Order Specific Question:   Increase infusion by:    Answer:   2 milli-units/min (2  mL/hr)  . DISCONTD: ibuprofen (ADVIL) tablet 600 mg  . DISCONTD: acetaminophen (TYLENOL) tablet 650 mg  . DISCONTD: zolpidem (AMBIEN) tablet 5 mg  . DISCONTD: diphenhydrAMINE (BENADRYL) capsule 25 mg  . DISCONTD: senna-docusate (Senokot-S) tablet 2 tablet  . DISCONTD: simethicone (MYLICON) chewable tablet 80 mg  . DISCONTD: ondansetron (ZOFRAN) tablet 4 mg  . DISCONTD: ondansetron (ZOFRAN) injection 4 mg  . DISCONTD: prenatal multivitamin tablet 1 tablet  . DISCONTD: witch hazel-glycerin (TUCKS) pad 1 application  . DISCONTD: dibucaine (NUPERCAINAL) 1 % rectal ointment 1 application  . DISCONTD: benzocaine-Menthol (DERMOPLAST) 20-0.5 % topical spray 1 application  . DISCONTD: coconut oil  . DISCONTD: Tdap (BOOSTRIX) injection 0.5 mL  . DISCONTD: oxyCODONE (Oxy IR/ROXICODONE) immediate release tablet 5 mg  . DISCONTD: oxyCODONE (Oxy IR/ROXICODONE) immediate release tablet 10 mg  . ibuprofen (ADVIL) 600 MG tablet    Sig: Take 1 tablet (600 mg total) by mouth every 6 (six) hours.    Dispense:  30 tablet    Refill:  0  . acetaminophen (TYLENOL) 325 MG tablet    Sig: Take 2 tablets (650 mg total) by mouth every 4 (four) hours as needed (for pain scale < 4).    Dispense:  30 tablet    Refill:  1    Assessment/Plan: SHIRLETTE SCARBER is a 20 y.o. female, G3P1011, IUP at 40.6 weeks, presenting for SROM meconium at 0525am 4/7 with cxt continue since 0300 in active labor at 6.5cm. GBS-. LR Female. LPC at 17 weeks. ANemai with hgb at 10 at 28 weeks, PO Iron.  Pt endorse + Fm. Denies vaginal bleeding.  FWB: Cat 1 Fetal Tracing.   Plan: Ad[facility] to [facility]  Routine [facility] orders Pain med/epidural prn Expectant management.  Anticipate labor progression   Dale Owosso NP-C, CNM, MSN 09/22/2020, 10:39 AM

## 2020-10-04 IMAGING — CR DG CHEST 2V
2 series · 2 of 2 positions shown · non-contrast
Comparison: None

CLINICAL DATA: Shortness of breath and chest discomfort for several
weeks, 32 weeks pregnant

EXAM:
CHEST - 2 VIEW

[chest pa]
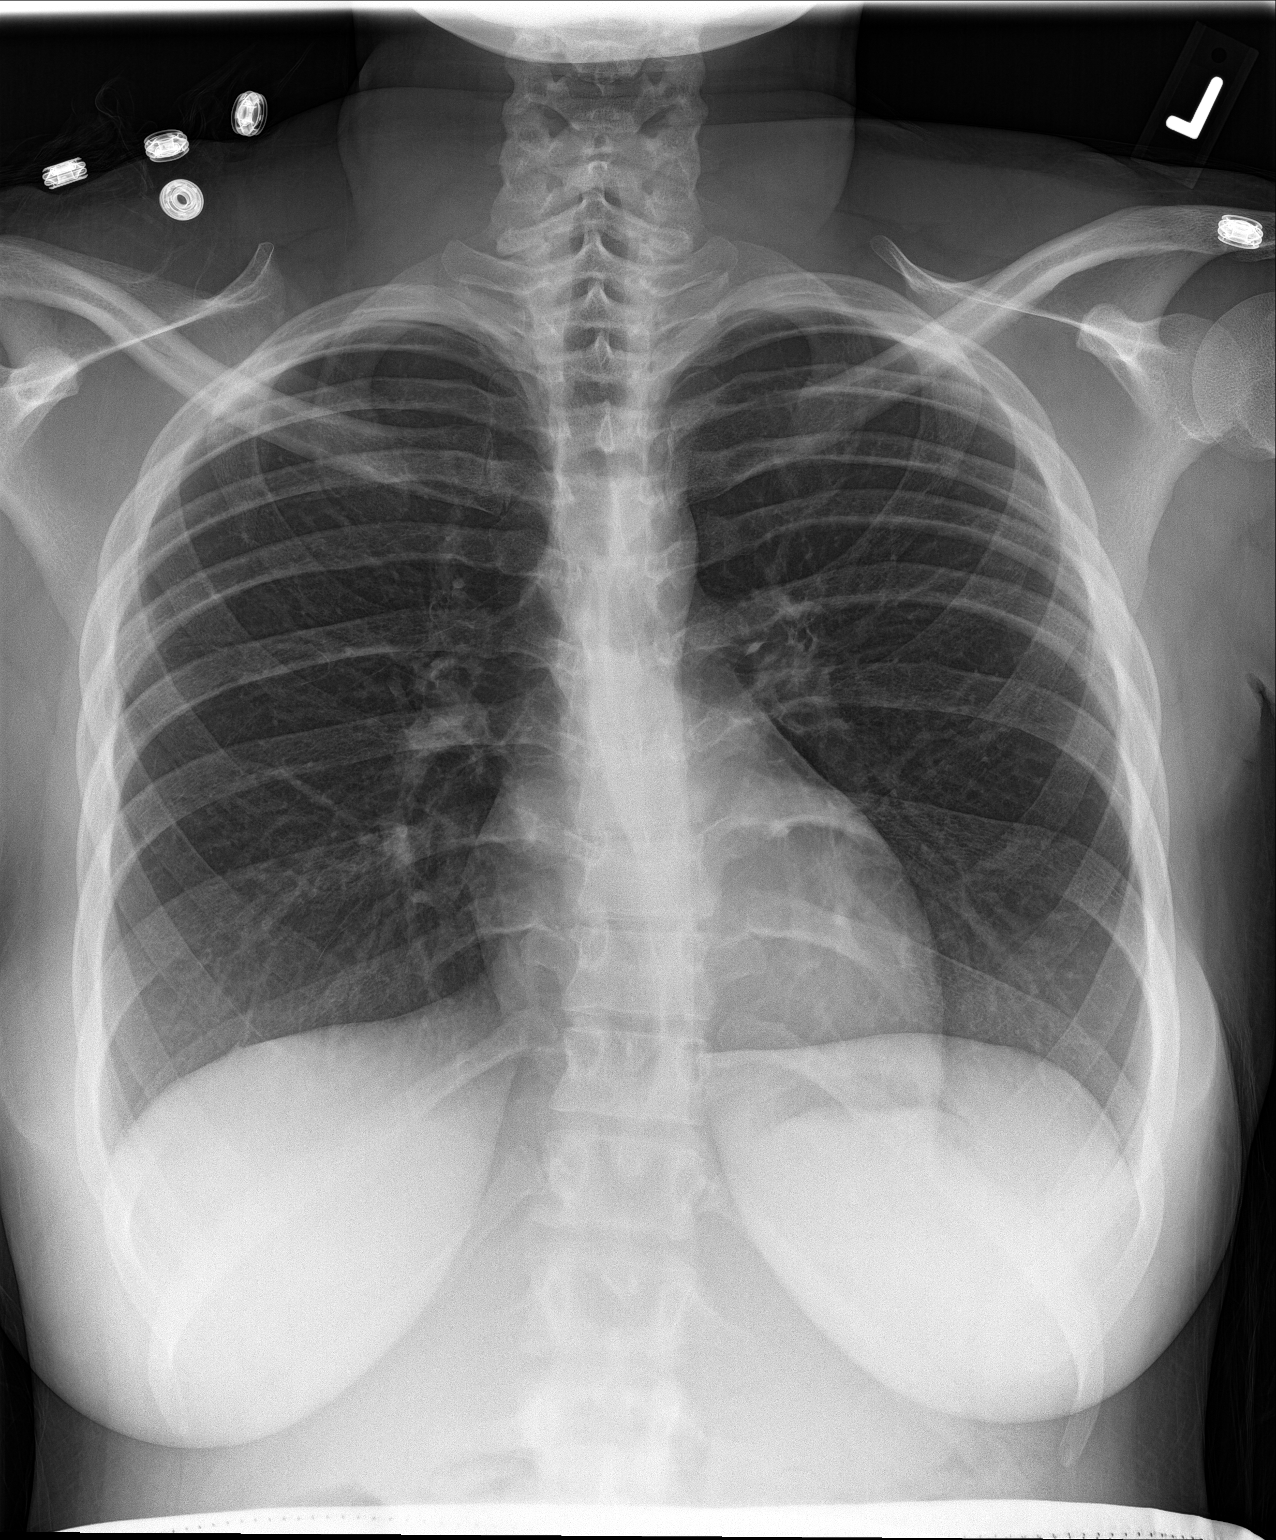

[chest lat]
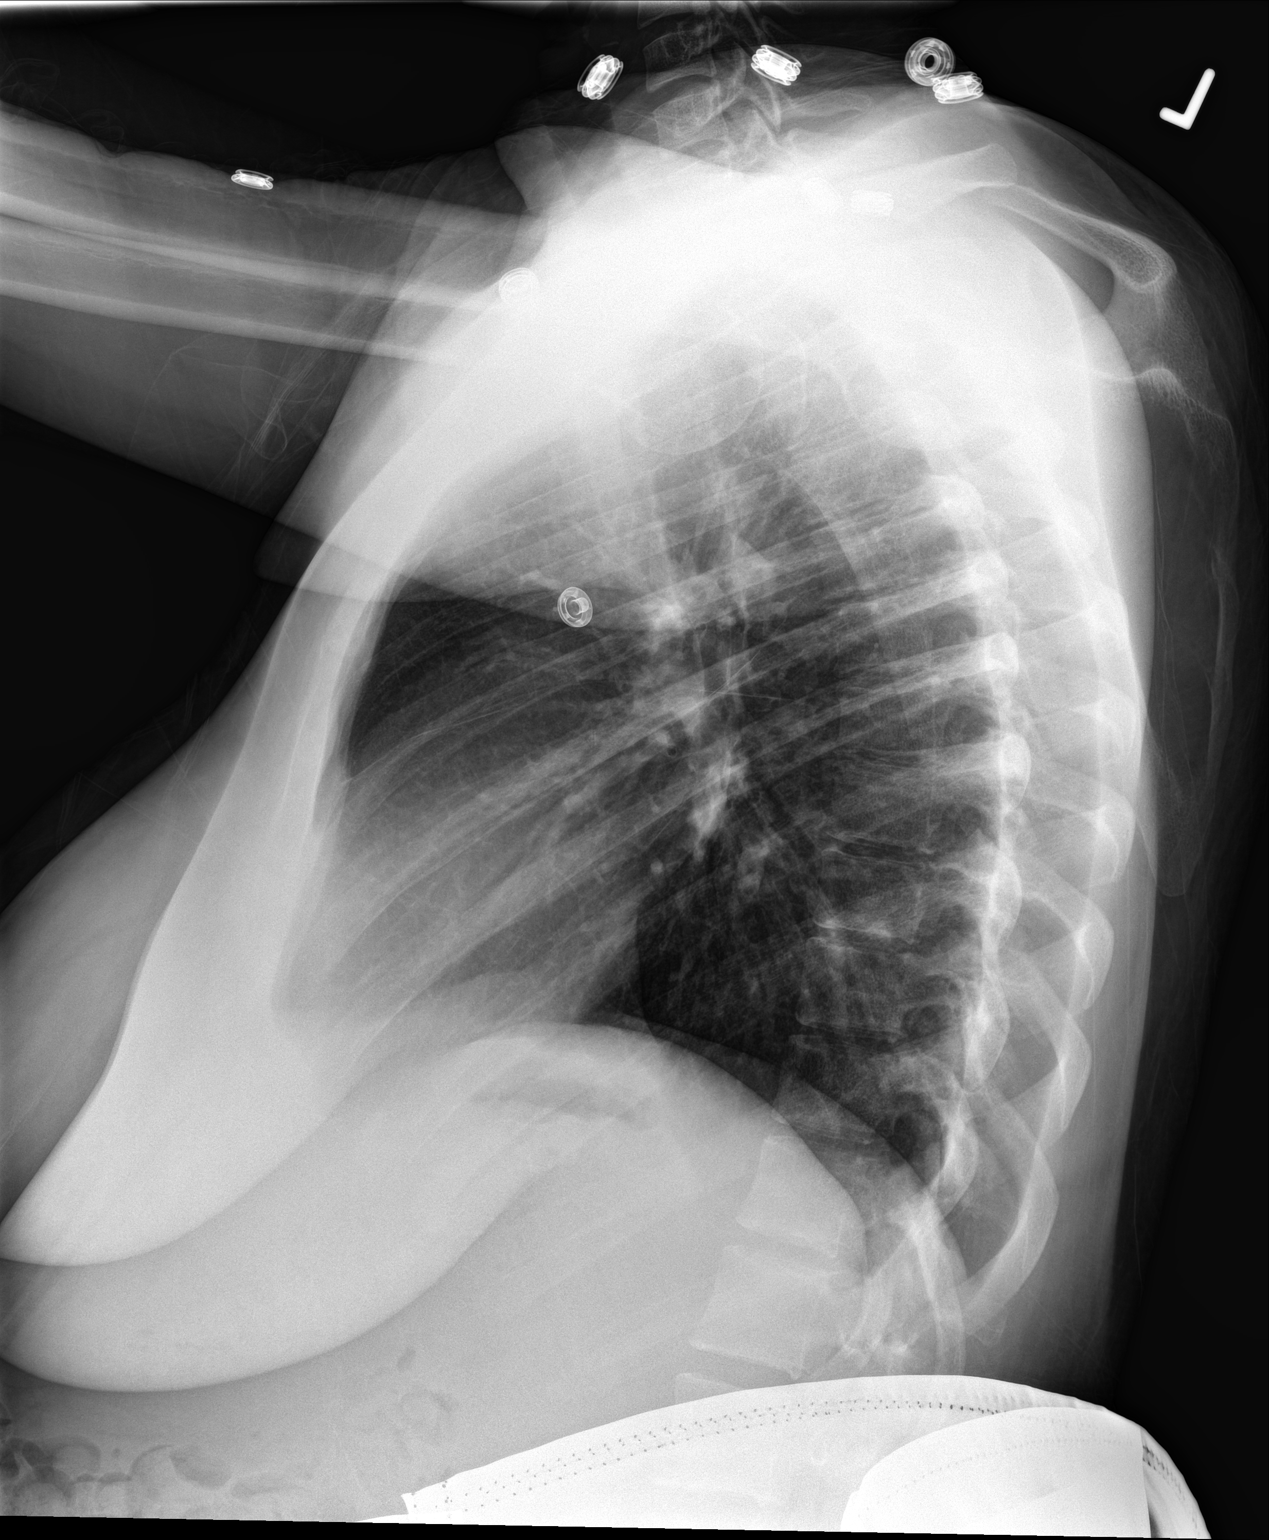

[2 of 2 positions shown; findings below may reference images not displayed]

FINDINGS: Abdomen shielded.

Normal heart size, mediastinal contours, and pulmonary vascularity.

Lungs clear.

No pleural effusion or pneumothorax.

Bones unremarkable.
IMPRESSION: Normal exam.

## 2021-03-06 ENCOUNTER — Ambulatory Visit: Payer: Self-pay

## 2021-06-11 NOTE — L&D Delivery Note (Signed)
Delivery Note ? ? ?Patient Name: [name] ?DOB: 2000/10/11 ?MRN: [id number] ? ?Date of admission: 09/02/2021 ?Delivering MD: Oliver Hum, Estera Ozier  ?Date of delivery: 09/02/21 ?Type of delivery: SVD ? ?Newborn Data: ?Live born female  ?Birth Weight:   ?APGAR: 9, 9 ? ?Newborn Delivery   ?Birth date/time: 09/02/2021 04:09:00 ?Delivery type: Vaginal, Spontaneous ?  ?  ?Christy Moran, 20 y.o., @ [redacted]w[redacted]d,  [id number], who was admitted for spontaneous labor, active. I was called to the room when she progressed 2+ station in the second stage of labor.  She pushed for 5/min.  She delivered a viable infant, cephalic and restituted to the OA position over an intact perineum.  A nuchal cord   was not identified. The baby was placed on maternal abdomen while initial step of NRP were perfmored (Dry, Stimulated, and warmed). Hat placed on baby for thermoregulation. Delayed cord clamping was performed for 2 minutes.  Cord double clamped and cut.  Cord cut by freind. Apgar scores were 9 and 9. Prophylactic Pitocin was started in the third stage of labor for active management. The placenta delivered spontaneously, shultz, with a 3 vessel cord and was sent to LD.  Inspection revealed 1st degree and hemostatic no repair required. . An examination of the vaginal vault and cervix was free from lacerations. Uterus was boggy , fundal massage done, bleeding brisk TXA, cytotec rectal and IM methergine with postpartum pit given. The uterus was firm, bleeding stable.   Placenta and umbilical artery blood gas were not sent.  There were no complications during the procedure.  Mom and baby skin to skin following delivery. Left in stable condition. ? ?Maternal Info: ?Anesthesia: None ?Episiotomy: no ?Lacerations:  1st no repair ?Suture Repair: no ?Est. Blood Loss (mL):  ? ?Newborn Info: ? ?Baby Sex: female ?Circumcision: no ? ?APGAR (1 MIN): 9   ?APGAR (5 MINS): 9   ?APGAR (10 MINS):   ? ? ?Mom to postpartum.  Baby to Couplet care / Skin to  Skin. ? ? ?[facility], [place], NP-C ?09/02/21 ?4:44 AM ? ? ? ?  ?

## 2021-06-27 ENCOUNTER — Inpatient Hospital Stay (HOSPITAL_COMMUNITY)
Admission: AD | Admit: 2021-06-27 | Discharge: 2021-06-27 | Disposition: A | Discharge status: Home / Self Care | Payer: Medicaid Other | Attending: Obstetrics & Gynecology | Admitting: Obstetrics & Gynecology

## 2021-06-27 ENCOUNTER — Encounter (HOSPITAL_COMMUNITY): Payer: Self-pay | Admitting: *Deleted

## 2021-06-27 DIAGNOSIS — O218 Other vomiting complicating pregnancy: Secondary | ICD-10-CM | POA: Diagnosis present

## 2021-06-27 DIAGNOSIS — Z3A28 28 weeks gestation of pregnancy: Secondary | ICD-10-CM | POA: Insufficient documentation

## 2021-06-27 DIAGNOSIS — A084 Viral intestinal infection, unspecified: Secondary | ICD-10-CM | POA: Diagnosis not present

## 2021-06-27 LAB — URINALYSIS, ROUTINE W REFLEX MICROSCOPIC
Bilirubin Urine: NEGATIVE
Glucose, UA: NEGATIVE mg/dL
Hgb urine dipstick: NEGATIVE
Ketones, ur: 15 mg/dL — AB
Leukocytes,Ua: NEGATIVE
Nitrite: NEGATIVE
Protein, ur: NEGATIVE mg/dL
Specific Gravity, Urine: 1.02 (ref 1.005–1.030)
pH: 7.5 (ref 5.0–8.0)

## 2021-06-27 MED ORDER — LACTATED RINGERS IV BOLUS
1000.0000 mL | Freq: Once | INTRAVENOUS | Status: AC
  Administered 2021-06-27: 1000 mL via INTRAVENOUS

## 2021-06-27 MED ORDER — ONDANSETRON HCL 4 MG/2ML IJ SOLN
4.0000 mg | Freq: Once | INTRAMUSCULAR | Status: AC
  Administered 2021-06-27: 4 mg via INTRAVENOUS
  Filled 2021-06-27: qty 2

## 2021-06-27 MED ORDER — ONDANSETRON 4 MG PO TBDP: 4.0000 mg | ORAL_TABLET | Freq: Three times a day (TID) | ORAL | 0 refills | Status: DC | PRN

## 2021-06-27 NOTE — MAU Provider Note (Signed)
History     CSN: [id number]  Arrival date and time: 06/27/21 7001   Event Date/Time   First Provider Initiated Contact with Patient 06/27/21 1100      Chief Complaint  Patient presents with   Emesis   DALEY MOORADIAN is a 21 y.o. [id number] at [redacted]w[redacted]d who presents today with nausea and vomiting. She states that she started to get nauseous on 06/26/21 in the evening and then by 8:00pm she was vomiting and was vomiting every hour throughout the night. She ate chicken prior to the nausea starting. Denies any sick contacts. She has had some contractions. She denies VB or  LOF. Reports normal fetal movement.   Emesis  This is a new problem. The current episode started yesterday. The problem occurs 5 to 10 times per day. The problem has been unchanged. The emesis has an appearance of stomach contents. There has been no fever. She has tried nothing for the symptoms.   OB History     Gravida  4   Para  2   Term  2   Preterm      AB  1   Living  2      SAB      IAB  1   Ectopic      Multiple  0   Live Births  2           Past Medical History:  Diagnosis Date   Anemia    Hx of gonorrhea     Past Surgical History:  Procedure Laterality Date   INDUCED ABORTION     NO PAST SURGERIES      Family History  Problem Relation Age of Onset   Hypertension Mother    Hypertension Father    Stroke Maternal Uncle    Cancer Maternal Grandfather        prostate   Cancer Paternal Grandfather    Drug abuse Neg Hx     Social History   Tobacco Use   Smoking status: Never   Smokeless tobacco: Never  Vaping Use   Vaping Use: Never used  Substance Use Topics   Alcohol use: Not Currently    Comment: occ   Drug use: Never    Allergies: No Known Allergies  Medications Prior to Admission  Medication Sig Dispense Refill Last Dose   Prenatal Vit-Fe Fumarate-FA (PRENATAL MULTIVITAMIN) TABS tablet Take 1 tablet by mouth daily at 12 noon.   06/26/2021   acetaminophen  (TYLENOL) 325 MG tablet Take 2 tablets (650 mg total) by mouth every 4 (four) hours as needed (for pain scale < 4). 30 tablet 1    ibuprofen (ADVIL) 600 MG tablet Take 1 tablet (600 mg total) by mouth every 6 (six) hours. 30 tablet 0     Review of Systems  Gastrointestinal:  Positive for vomiting.  All other systems reviewed and are negative. Physical Exam   Blood pressure 118/67, pulse 88, temperature 98.6 F (37 C), temperature source Oral, resp. rate 16, height 5\' 4"  (1.626 m), weight 78.5 kg, SpO2 100 %, unknown if currently breastfeeding.  Physical Exam Vitals and nursing note reviewed.  Constitutional:      General: She is not in acute distress. HENT:     Head: Normocephalic.  Eyes:     Pupils: Pupils are equal, round, and reactive to light.  Cardiovascular:     Rate and Rhythm: Normal rate.  Pulmonary:     Effort: Pulmonary effort is normal.  Abdominal:  Palpations: Abdomen is soft.     Tenderness: There is no abdominal tenderness.  Musculoskeletal:        General: Normal range of motion.  Skin:    General: Skin is warm and dry.  Neurological:     Mental Status: She is alert and oriented to person, place, and time.  Psychiatric:        Mood and Affect: Mood normal.        Behavior: Behavior normal.   NST:  Baseline: 130 Variability: moderate Accels: 15X15 Decels: none Toco: rare Reactive/Appropriate for GA  Results for orders placed or performed during the hospital encounter of 06/27/21 (from the past 24 hour(s))  Urinalysis, Routine w reflex microscopic Urine, Clean Catch     Status: Abnormal   Collection Time: 06/27/21 11:22 AM  Result Value Ref Range   Color, Urine YELLOW YELLOW   APPearance CLEAR CLEAR   Specific Gravity, Urine 1.020 1.005 - 1.030   pH 7.5 5.0 - 8.0   Glucose, UA NEGATIVE NEGATIVE mg/dL   Hgb urine dipstick NEGATIVE NEGATIVE   Bilirubin Urine NEGATIVE NEGATIVE   Ketones, ur 15 (A) NEGATIVE mg/dL   Protein, ur NEGATIVE NEGATIVE  mg/dL   Nitrite NEGATIVE NEGATIVE   Leukocytes,Ua NEGATIVE NEGATIVE      MAU Course  Procedures  MDM Patient has had 1L of LR and 4mg  IV zofran. She reports feeling better. PO challenge and she is able to tolerate PO now.   Assessment and Plan   1. Viral gastroenteritis   2. [redacted] weeks gestation of pregnancy    DC home Comfort measures reviewed  3rd Trimester precautions  Bleeding precautions PTL precautions  Fetal kick counts RX: zofran 4mg  ODT PRN  Return to MAU as needed FU with OB as planned   DNP, CNM  06/27/21  1:04 PM

## 2021-06-27 NOTE — MAU Note (Signed)
Nausea started yesterday.  About 8:00, started throwing up so much.  Has been throwing up about every hr since.  Last was 0600.   Feels weak now.  Thinks it might be food poisoning, hasn't been sick the entire preg.  Has been seen at OB/GYN for preg.

## 2021-08-23 ENCOUNTER — Other Ambulatory Visit: Payer: Self-pay

## 2021-08-23 ENCOUNTER — Emergency Department (HOSPITAL_BASED_OUTPATIENT_CLINIC_OR_DEPARTMENT_OTHER)
Admission: EM | Admit: 2021-08-23 | Discharge: 2021-08-23 | Disposition: A | Discharge status: Home / Self Care | Payer: Medicaid Other | Attending: Emergency Medicine | Admitting: Emergency Medicine

## 2021-08-23 ENCOUNTER — Encounter (HOSPITAL_BASED_OUTPATIENT_CLINIC_OR_DEPARTMENT_OTHER): Payer: Self-pay | Admitting: Urology

## 2021-08-23 DIAGNOSIS — Z3A00 Weeks of gestation of pregnancy not specified: Secondary | ICD-10-CM | POA: Diagnosis not present

## 2021-08-23 DIAGNOSIS — H6591 Unspecified nonsuppurative otitis media, right ear: Secondary | ICD-10-CM | POA: Insufficient documentation

## 2021-08-23 DIAGNOSIS — O99891 Other specified diseases and conditions complicating pregnancy: Secondary | ICD-10-CM | POA: Insufficient documentation

## 2021-08-23 DIAGNOSIS — R0981 Nasal congestion: Secondary | ICD-10-CM | POA: Diagnosis not present

## 2021-08-23 DIAGNOSIS — O26893 Other specified pregnancy related conditions, third trimester: Secondary | ICD-10-CM | POA: Diagnosis present

## 2021-08-23 MED ORDER — ACETAMINOPHEN 325 MG PO TABS
650.0000 mg | ORAL_TABLET | Freq: Once | ORAL | Status: AC
  Administered 2021-08-23: 650 mg via ORAL
  Filled 2021-08-23: qty 2

## 2021-08-23 MED ORDER — OXYCODONE HCL 5 MG PO TABS
5.0000 mg | ORAL_TABLET | Freq: Once | ORAL | Status: AC
  Administered 2021-08-23: 5 mg via ORAL
  Filled 2021-08-23: qty 1

## 2021-08-23 MED ORDER — AMOXICILLIN-POT CLAVULANATE 875-125 MG PO TABS: 1.0000 | ORAL_TABLET | Freq: Two times a day (BID) | ORAL | 0 refills | Status: AC

## 2021-08-23 MED ORDER — METOCLOPRAMIDE HCL 10 MG PO TABS
5.0000 mg | ORAL_TABLET | Freq: Once | ORAL | Status: AC
  Administered 2021-08-23: 5 mg via ORAL
  Filled 2021-08-23: qty 1

## 2021-08-23 MED ORDER — AMOXICILLIN-POT CLAVULANATE 875-125 MG PO TABS
1.0000 | ORAL_TABLET | Freq: Once | ORAL | Status: AC
  Administered 2021-08-23: 1 via ORAL
  Filled 2021-08-23: qty 1

## 2021-08-23 NOTE — Discharge Instructions (Addendum)
Contact a health care provider if: You have bleeding from your nose. There is a lump on your neck. You are not feeling better in 5 days. You feel worse instead of better. Get help right away if: You have severe pain that is not controlled with medicine. You have swelling, redness, or pain around your ear. You have stiffness in your neck. A part of your face is not moving (paralyzed). The bone behind your ear (mastoid bone) is tender when you touch it. You develop a severe headache. 

## 2021-08-23 NOTE — ED Triage Notes (Signed)
Right ear pain that started this morning ?Nasal congestion ? ?Approx 8 mon pregnant  ?G-3. P-2, A-0 ?

## 2021-08-23 NOTE — ED Provider Notes (Signed)
?[facility] EMERGENCY DEPARTMENT ?Provider Note ? ? ?CSN: [id number] ?Arrival date & time: 08/23/21  2035 ? ?  ? ?History ? ?Chief Complaint  ?Patient presents with  ? Otalgia  ?Christy Moran is a 21 y/o F who is currently 8 months pregnant presents to the ED for R ear pain that began today. Pt states her pain began acutely and radiates down into her throat. Pt states she has felt congested the last two days, but denies rhinorrhea, fever or chills. She has applied hydrogen peroxide and ear drops to her R ear without any relief. Denies any abd pain/contraction, LOF, or vaginal bleeding. Pt denies CP, SOB, NVD, urinary sxs, or any other medical complaints. ? ? ?  ? ?Home Medications ?Prior to Admission medications   ?Medication Sig Start Date End Date Taking? Authorizing Provider  ?acetaminophen (TYLENOL) 325 MG tablet Take 2 tablets (650 mg total) by mouth every 4 (four) hours as needed (for pain scale < 4). 09/16/20   Crumpler, Lilyan Gilford, CNM  ?ondansetron (ZOFRAN-ODT) 4 MG disintegrating tablet Take 1 tablet (4 mg total) by mouth every 8 (eight) hours as needed for nausea or vomiting. 06/27/21   Armando Reichert, CNM  ?Prenatal Vit-Fe Fumarate-FA (PRENATAL MULTIVITAMIN) TABS tablet Take 1 tablet by mouth daily at 12 noon.    [provider]  ?   ? ?Allergies    ?Patient has no known allergies.   ? ?Review of Systems   ?Review of Systems  ?Constitutional:  Negative for fever.  ?HENT:  Positive for ear pain. Negative for ear discharge and sore throat.   ?Gastrointestinal:  Negative for abdominal pain.  ? ?Physical Exam ?Updated Vital Signs ?BP 125/83 (BP Location: Left Arm)   Pulse 96   Temp 97.9 ?F (36.6 ?C) (Oral)   Resp 18   Ht 5\' 4"  (1.626 m)   Wt 83 kg   SpO2 100%   BMI 31.41 kg/m?  ?Physical Exam ?Vitals and nursing note reviewed.  ?Constitutional:   ?   General: She is not in acute distress. ?   Appearance: She is well-developed. She is not diaphoretic.  ?HENT:  ?   Head: Normocephalic  and atraumatic.  ?   Right Ear: External ear normal. A middle ear effusion is present. No mastoid tenderness. Tympanic membrane is injected and bulging.  ?   Left Ear: Hearing, tympanic membrane, ear canal and external ear normal.  ?   Nose: Nose normal.  ?   Mouth/Throat:  ?   Mouth: Mucous membranes are moist.  ?Eyes:  ?   General: No scleral icterus. ?   Conjunctiva/sclera: Conjunctivae normal.  ?Cardiovascular:  ?   Rate and Rhythm: Normal rate and regular rhythm.  ?   Heart sounds: Normal heart sounds. No murmur heard. ?  No friction rub. No gallop.  ?Pulmonary:  ?   Effort: Pulmonary effort is normal. No respiratory distress.  ?   Breath sounds: Normal breath sounds.  ?Abdominal:  ?   General: Bowel sounds are normal. There is no distension.  ?   Palpations: Abdomen is soft. There is no mass.  ?   Tenderness: There is no abdominal tenderness. There is no guarding.  ?Musculoskeletal:  ?   Cervical back: Normal range of motion.  ?Skin: ?   General: Skin is warm and dry.  ?Neurological:  ?   Mental Status: She is alert and oriented to person, place, and time.  ?Psychiatric:     ?  Behavior: Behavior normal.  ? ? ?ED Results / Procedures / Treatments   ?Labs ?(all labs ordered are listed, but only abnormal results are displayed) ?Labs Reviewed - No data to display ? ?EKG ?None ? ?Radiology ?No results found. ? ?Procedures ?Procedures  ? ? ?Medications Ordered in ED ?Medications  ?oxyCODONE (Oxy IR/ROXICODONE) immediate release tablet 5 mg (has no administration in time range)  ?amoxicillin-clavulanate (AUGMENTIN) 875-125 MG per tablet 1 tablet (has no administration in time range)  ?acetaminophen (TYLENOL) tablet 650 mg (650 mg Oral Given 08/23/21 2150)  ?metoCLOPramide (REGLAN) tablet 5 mg (5 mg Oral Given 08/23/21 2155)  ? ? ?ED Course/ Medical Decision Making/ A&P ?  ?                        ?Medical Decision Making ?Patient presents with otalgia and exam consistent with acute otitis media. No concern for acute  mastoiditis, meningitis.  Patient discharged home with Augmentin.  Advised parents to call Physician today for follow-up.  I have also discussed reasons to return immediately to the ER.  patient expresses understanding and agrees with plan. ? ? ? ? ? ? ? ?Final Clinical Impression(s) / ED Diagnoses ?Final diagnoses:  ?Right otitis media with effusion  ? ? ?Rx / DC Orders ?ED Discharge Orders   ? ? None  ? ?  ? ? ?  ?Arthor Captain, PA-C ?08/23/21 2216 ? ?  ?Pricilla Loveless, MD ?08/23/21 2326 ? ?

## 2021-09-02 ENCOUNTER — Encounter (HOSPITAL_COMMUNITY): Payer: Self-pay | Admitting: Obstetrics and Gynecology

## 2021-09-02 ENCOUNTER — Inpatient Hospital Stay (HOSPITAL_COMMUNITY)
Admission: AD | Admit: 2021-09-02 | Discharge: 2021-09-04 | DRG: 806 | Disposition: A | Discharge status: Home / Self Care | Payer: Medicaid Other | Attending: Family Medicine | Admitting: Family Medicine

## 2021-09-02 ENCOUNTER — Other Ambulatory Visit: Payer: Self-pay

## 2021-09-02 ENCOUNTER — Inpatient Hospital Stay (HOSPITAL_COMMUNITY): Payer: Medicaid Other | Admitting: Anesthesiology

## 2021-09-02 DIAGNOSIS — D62 Acute posthemorrhagic anemia: Secondary | ICD-10-CM | POA: Diagnosis not present

## 2021-09-02 DIAGNOSIS — O26893 Other specified pregnancy related conditions, third trimester: Secondary | ICD-10-CM | POA: Diagnosis present

## 2021-09-02 DIAGNOSIS — Z3A38 38 weeks gestation of pregnancy: Secondary | ICD-10-CM

## 2021-09-02 DIAGNOSIS — R030 Elevated blood-pressure reading, without diagnosis of hypertension: Secondary | ICD-10-CM | POA: Diagnosis present

## 2021-09-02 DIAGNOSIS — O9081 Anemia of the puerperium: Secondary | ICD-10-CM | POA: Diagnosis not present

## 2021-09-02 LAB — COMPREHENSIVE METABOLIC PANEL
ALT: 22 U/L (ref 0–44)
AST: 28 U/L (ref 15–41)
Albumin: 2.8 g/dL — ABNORMAL LOW (ref 3.5–5.0)
Alkaline Phosphatase: 99 U/L (ref 38–126)
Anion gap: 9 (ref 5–15)
BUN: 8 mg/dL (ref 6–20)
CO2: 20 mmol/L — ABNORMAL LOW (ref 22–32)
Calcium: 9 mg/dL (ref 8.9–10.3)
Chloride: 107 mmol/L (ref 98–111)
Creatinine, Ser: 0.91 mg/dL (ref 0.44–1.00)
GFR, Estimated: >60 mL/min (ref >60)
Glucose, Bld: 96 mg/dL (ref 70–99)
Potassium: 3.6 mmol/L (ref 3.5–5.1)
Sodium: 136 mmol/L (ref 135–145)
Total Bilirubin: 0.3 mg/dL (ref 0.3–1.2)
Total Protein: 6.7 g/dL (ref 6.5–8.1)

## 2021-09-02 LAB — CBC
HCT: 31.3 % — ABNORMAL LOW (ref 36.0–46.0)
Hemoglobin: 10 g/dL — ABNORMAL LOW (ref 12.0–15.0)
MCH: 27.2 pg (ref 26.0–34.0)
MCHC: 31.9 g/dL (ref 30.0–36.0)
MCV: 85.3 fL (ref 80.0–100.0)
Platelets: 191 10*3/uL (ref 150–400)
RBC: 3.67 MIL/uL — ABNORMAL LOW (ref 3.87–5.11)
RDW: 14.4 % (ref 11.5–15.5)
WBC: 7.9 10*3/uL (ref 4.0–10.5)
nRBC: 0 % (ref 0.0–0.2)

## 2021-09-02 LAB — TYPE AND SCREEN
ABO/RH(D): A POS
Antibody Screen: NEGATIVE

## 2021-09-02 LAB — RPR: RPR Ser Ql: NONREACTIVE

## 2021-09-02 MED ORDER — LACTATED RINGERS IV SOLN: 500.0000 mL | INTRAVENOUS | Status: DC | PRN

## 2021-09-02 MED ORDER — FENTANYL-BUPIVACAINE-NACL 0.5-0.125-0.9 MG/250ML-% EP SOLN
12.0000 mL/h | EPIDURAL | Status: DC | PRN
  Filled 2021-09-02: qty 250

## 2021-09-02 MED ORDER — METHYLERGONOVINE MALEATE 0.2 MG/ML IJ SOLN: 0.2000 mg | Freq: Once | INTRAMUSCULAR | Status: DC

## 2021-09-02 MED ORDER — COCONUT OIL OIL: 1.0000 "application " | TOPICAL_OIL | Status: DC | PRN

## 2021-09-02 MED ORDER — TRANEXAMIC ACID-NACL 1000-0.7 MG/100ML-% IV SOLN
INTRAVENOUS | Status: AC
  Administered 2021-09-02: 1000 mg via INTRAVENOUS
  Filled 2021-09-02: qty 100

## 2021-09-02 MED ORDER — SOD CITRATE-CITRIC ACID 500-334 MG/5ML PO SOLN: 30.0000 mL | ORAL | Status: DC | PRN

## 2021-09-02 MED ORDER — SENNOSIDES-DOCUSATE SODIUM 8.6-50 MG PO TABS
2.0000 | ORAL_TABLET | Freq: Every day | ORAL | Status: DC
  Administered 2021-09-03 – 2021-09-04 (×2): 2 via ORAL
  Filled 2021-09-02 (×2): qty 2

## 2021-09-02 MED ORDER — MISOPROSTOL 200 MCG PO TABS
ORAL_TABLET | ORAL | Status: AC
  Filled 2021-09-02: qty 5

## 2021-09-02 MED ORDER — DIBUCAINE (PERIANAL) 1 % EX OINT: 1.0000 "application " | TOPICAL_OINTMENT | CUTANEOUS | Status: DC | PRN

## 2021-09-02 MED ORDER — OXYCODONE-ACETAMINOPHEN 5-325 MG PO TABS: 1.0000 | ORAL_TABLET | ORAL | Status: DC | PRN

## 2021-09-02 MED ORDER — LACTATED RINGERS IV SOLN: 500.0000 mL | Freq: Once | INTRAVENOUS | Status: DC

## 2021-09-02 MED ORDER — LACTATED RINGERS IV SOLN: INTRAVENOUS | Status: DC

## 2021-09-02 MED ORDER — FENTANYL CITRATE (PF) 100 MCG/2ML IJ SOLN
50.0000 ug | INTRAMUSCULAR | Status: DC | PRN
  Administered 2021-09-02 (×2): 100 ug via INTRAVENOUS
  Filled 2021-09-02 (×2): qty 2

## 2021-09-02 MED ORDER — OXYTOCIN-SODIUM CHLORIDE 30-0.9 UT/500ML-% IV SOLN
2.5000 [IU]/h | INTRAVENOUS | Status: DC
  Administered 2021-09-02: 2.5 [IU]/h via INTRAVENOUS
  Filled 2021-09-02: qty 500

## 2021-09-02 MED ORDER — ONDANSETRON HCL 4 MG/2ML IJ SOLN
4.0000 mg | Freq: Four times a day (QID) | INTRAMUSCULAR | Status: DC | PRN
  Administered 2021-09-02: 4 mg via INTRAVENOUS
  Filled 2021-09-02: qty 2

## 2021-09-02 MED ORDER — BENZOCAINE-MENTHOL 20-0.5 % EX AERO
1.0000 "application " | INHALATION_SPRAY | CUTANEOUS | Status: DC | PRN
  Administered 2021-09-02: 1 via TOPICAL
  Filled 2021-09-02: qty 56

## 2021-09-02 MED ORDER — METHYLERGONOVINE MALEATE 0.2 MG/ML IJ SOLN
INTRAMUSCULAR | Status: AC
  Administered 2021-09-02: 0.2 mg via INTRAMUSCULAR
  Filled 2021-09-02: qty 1

## 2021-09-02 MED ORDER — TETANUS-DIPHTH-ACELL PERTUSSIS 5-2.5-18.5 LF-MCG/0.5 IM SUSY: 0.5000 mL | PREFILLED_SYRINGE | Freq: Once | INTRAMUSCULAR | Status: DC

## 2021-09-02 MED ORDER — LIDOCAINE HCL (PF) 1 % IJ SOLN: 30.0000 mL | INTRAMUSCULAR | Status: DC | PRN

## 2021-09-02 MED ORDER — ONDANSETRON HCL 4 MG PO TABS: 4.0000 mg | ORAL_TABLET | ORAL | Status: DC | PRN

## 2021-09-02 MED ORDER — ZOLPIDEM TARTRATE 5 MG PO TABS: 5.0000 mg | ORAL_TABLET | Freq: Every evening | ORAL | Status: DC | PRN

## 2021-09-02 MED ORDER — MISOPROSTOL 200 MCG PO TABS
1000.0000 ug | ORAL_TABLET | Freq: Once | ORAL | Status: AC
  Administered 2021-09-02: 1000 ug via RECTAL

## 2021-09-02 MED ORDER — OXYTOCIN-SODIUM CHLORIDE 30-0.9 UT/500ML-% IV SOLN
INTRAVENOUS | Status: AC
  Filled 2021-09-02: qty 500

## 2021-09-02 MED ORDER — ONDANSETRON HCL 4 MG/2ML IJ SOLN: 4.0000 mg | INTRAMUSCULAR | Status: DC | PRN

## 2021-09-02 MED ORDER — SIMETHICONE 80 MG PO CHEW
80.0000 mg | CHEWABLE_TABLET | ORAL | Status: DC | PRN
  Administered 2021-09-02: 80 mg via ORAL
  Filled 2021-09-02: qty 1

## 2021-09-02 MED ORDER — ACETAMINOPHEN 325 MG PO TABS
650.0000 mg | ORAL_TABLET | ORAL | Status: DC | PRN
  Administered 2021-09-02 – 2021-09-03 (×4): 650 mg via ORAL
  Filled 2021-09-02 (×4): qty 2

## 2021-09-02 MED ORDER — WITCH HAZEL-GLYCERIN EX PADS: 1.0000 "application " | MEDICATED_PAD | CUTANEOUS | Status: DC | PRN

## 2021-09-02 MED ORDER — PRENATAL MULTIVITAMIN CH
1.0000 | ORAL_TABLET | Freq: Every day | ORAL | Status: DC
  Administered 2021-09-02 – 2021-09-03 (×2): 1 via ORAL
  Filled 2021-09-02 (×2): qty 1

## 2021-09-02 MED ORDER — TRANEXAMIC ACID-NACL 1000-0.7 MG/100ML-% IV SOLN: 1000.0000 mg | INTRAVENOUS | Status: DC

## 2021-09-02 MED ORDER — DIPHENHYDRAMINE HCL 50 MG/ML IJ SOLN: 12.5000 mg | INTRAMUSCULAR | Status: DC | PRN

## 2021-09-02 MED ORDER — PHENYLEPHRINE 40 MCG/ML (10ML) SYRINGE FOR IV PUSH (FOR BLOOD PRESSURE SUPPORT): 80.0000 ug | PREFILLED_SYRINGE | INTRAVENOUS | Status: DC | PRN

## 2021-09-02 MED ORDER — OXYTOCIN BOLUS FROM INFUSION
333.0000 mL | Freq: Once | INTRAVENOUS | Status: AC
  Administered 2021-09-02: 333 mL via INTRAVENOUS

## 2021-09-02 MED ORDER — IBUPROFEN 600 MG PO TABS
600.0000 mg | ORAL_TABLET | Freq: Four times a day (QID) | ORAL | Status: DC
  Administered 2021-09-02 – 2021-09-04 (×9): 600 mg via ORAL
  Filled 2021-09-02 (×9): qty 1

## 2021-09-02 MED ORDER — OXYTOCIN 10 UNIT/ML IJ SOLN
INTRAMUSCULAR | Status: AC
  Filled 2021-09-02: qty 1

## 2021-09-02 MED ORDER — ACETAMINOPHEN 325 MG PO TABS: 650.0000 mg | ORAL_TABLET | ORAL | Status: DC | PRN

## 2021-09-02 MED ORDER — DIPHENHYDRAMINE HCL 25 MG PO CAPS: 25.0000 mg | ORAL_CAPSULE | Freq: Four times a day (QID) | ORAL | Status: DC | PRN

## 2021-09-02 MED ORDER — EPHEDRINE 5 MG/ML INJ: 10.0000 mg | INTRAVENOUS | Status: DC | PRN

## 2021-09-02 MED ORDER — OXYCODONE-ACETAMINOPHEN 5-325 MG PO TABS: 2.0000 | ORAL_TABLET | ORAL | Status: DC | PRN

## 2021-09-02 NOTE — MAU Provider Note (Signed)
S: Ms. Christy Moran is a 21 y.o. [phone] at [redacted]w[redacted]d  who presents to MAU today complaining of leaking of fluid. She denies vaginal bleeding. She endorses contractions. She reports normal fetal movement.   ? ?O: BP 136/75   Pulse 78   Temp 97.8 ?F (36.6 ?C) (Oral)   Resp 18   Ht 5\' 4"  (1.626 m)   Wt 86.4 kg   SpO2 99%   BMI 32.70 kg/m?  ?GENERAL: Well-developed, well-nourished female in no acute distress.  ?HEAD: Normocephalic, atraumatic.  ?CHEST: Normal effort of breathing, regular heart rate ?ABDOMEN: Soft, nontender, gravid ?PELVIC: Normal external female genitalia. Vagina is pink and rugated. Cervix with normal contour, no lesions. Normal discharge.  Negative pooling. [place]. ? ?Cervical exam:  ?Dilation: 2.5 ?Effacement (%): 50 ?Station: -3 ?Presentation: Undeterminable ?Exam by:: Victorino Dike, RN ? ? ?Fetal Monitoring: ?FHT: 120 bpm, Mod Var, -Decels, +Accels ?Toco: Q2-57min ? ?No results found for this or any previous visit (from the past 24 hour(s)). ? ? ?A: ?SIUP at [redacted]w[redacted]d  ?Membranes intact ?Cat I FT ? ?P: ?-Fern collected and negative. ?-Findings suggestive of yeast infection.  ?-Informed that will treat with 3 day treatment if not admitted for labor.  ?-NST Reactive ?-Will monitor and reassess ? ?Gavin Pound, CNM ?09/02/2021 2:25 AM ? ?Reassessment (3:06 AM) ? ?-Nurse requests cervical exam stating she unable to identify. ?-Provider to bedside. ?-Difficult to locate and assess cervix when no contraction present. ?-7-8/90/Ballotable and bulging bag with ctx.  ?-Admit to labor. ?-BSUS performed and cephalic presentation confirmed. ? ?[name] MSN, CNM ?Energy manager, [facility] for [employer] ? ? ? ?

## 2021-09-02 NOTE — H&P (Signed)
Christy Moran is a 21 y.o. female, [id number], IUP at 38.1 weeks, presenting for spontaneous labor. Anemia at NOB 10 on po iron. Late PNC at 23weeks. EFW 5.9lbs 71% vertex, anterior placenta. Pt endorse + Fm. Denies vaginal leakage. Denies vaginal bleeding. ? ?Patient Active Problem List  ? Diagnosis Date Noted  ? Normal labor and delivery 09/15/2020  ? Normal labor 07/17/2018  ? Encounter for supervision of normal first pregnancy in first trimester 02/14/2018  ?  ? ?Active Ambulatory Problems  ?  Diagnosis Date Noted  ? Encounter for supervision of normal first pregnancy in first trimester 02/14/2018  ? Normal labor 07/17/2018  ? Normal labor and delivery 09/15/2020  ? ?Resolved Ambulatory Problems  ?  Diagnosis Date Noted  ? No Resolved Ambulatory Problems  ? ?Past Medical History:  ?Diagnosis Date  ? Anemia   ? Hx of gonorrhea   ?  ? ? ?Medications Prior to Admission  ?Medication Sig Dispense Refill Last Dose  ? acetaminophen (TYLENOL) 325 MG tablet Take 2 tablets (650 mg total) by mouth every 4 (four) hours as needed (for pain scale < 4). 30 tablet 1 Past Week  ? ondansetron (ZOFRAN-ODT) 4 MG disintegrating tablet Take 1 tablet (4 mg total) by mouth every 8 (eight) hours as needed for nausea or vomiting. 20 tablet 0 Past Month  ? Prenatal Vit-Fe Fumarate-FA (PRENATAL MULTIVITAMIN) TABS tablet Take 1 tablet by mouth daily at 12 noon.   Past Week  ? ? ?Past Medical History:  ?Diagnosis Date  ? Anemia   ? Hx of gonorrhea   ?  ? ?No current facility-administered medications on file prior to encounter.  ? ?Current Outpatient Medications on File Prior to Encounter  ?Medication Sig Dispense Refill  ? acetaminophen (TYLENOL) 325 MG tablet Take 2 tablets (650 mg total) by mouth every 4 (four) hours as needed (for pain scale < 4). 30 tablet 1  ? ondansetron (ZOFRAN-ODT) 4 MG disintegrating tablet Take 1 tablet (4 mg total) by mouth every 8 (eight) hours as needed for nausea or vomiting. 20 tablet 0  ? Prenatal Vit-Fe  Fumarate-FA (PRENATAL MULTIVITAMIN) TABS tablet Take 1 tablet by mouth daily at 12 noon.    ?  ? ?No Known Allergies ? ?History of present pregnancy: ?Pt Info/Preference:  Screening/Consents:  Labs:   ?EDD: Estimated Date of Delivery: 09/15/21  ?Establised: No LMP recorded. Patient is pregnant.  Anatomy Scan: Date: 05/23/2022 ?Placenta Location: anterior Genetic Screen: [place]:LR female ?AFP:  ?First Tri: ?Quad:  ?Office: [facility]            First PNV: 23.4 weeks Blood Type --/--/A POS (04/07 [id number])  ?Language: english Last PNV: 37.6 weeks Rhogam    ?Flu Vaccine:  declined   Antibody NEG (04/07 0548)  ?TDaP vaccine UTD   GTT: Early: 5.4 ?Third Trimester: 3551  ?Feeding Plan: breast BTL: no Rubella:    ?Contraception: ??? VBAC: no RPR: NON REACTIVE (04/07 0554)   ?Circumcision: ???   HBsAg:    ?Pediatrician:  [facility]   HIV:     ?Prenatal Classes: no Additional US: See HPI GBS:  (For PCN allergy, check sensitivities)   ?    Chlamydia: neg  ?  MFM Referral/Consult:  GC: neg  ?Support Person: Friend    PAP: ???  ?Pain Management: Desires epidural Neonatologist Referral:  Hgb Electrophoresis:  AA  ?[facility]   Hgb NOB: 11.1    28W: 10.1  ? ?OB History   ? ? Gravida  ?  4  ? Para  ?2  ? Term  ?2  ? Preterm  ?   ? AB  ?1  ? Living  ?2  ?  ? ? SAB  ?   ? IAB  ?1  ? Ectopic  ?   ? Multiple  ?0  ? Live Births  ?2  ?   ?  ?  ? ?Past Medical History:  ?Diagnosis Date  ? Anemia   ? Hx of gonorrhea   ? ?Past Surgical History:  ?Procedure Laterality Date  ? INDUCED ABORTION    ? NO PAST SURGERIES    ? ?Family History: family history includes Cancer in her maternal grandfather and paternal grandfather; Hypertension in her father and mother; Stroke in her maternal uncle. ?Social History:  reports that she has never smoked. She has never used smokeless tobacco. She reports that she does not currently use alcohol. She reports that she does not use drugs. ? ? ?Prenatal Transfer Tool  ?Maternal Diabetes: No ?Genetic Screening:  Normal ?Maternal Ultrasounds/Referrals: Normal ?Fetal Ultrasounds or other Referrals:  None ?Maternal Substance Abuse:  No ?Significant Maternal Medications:  None ?Significant Maternal Lab Results: Group B Strep negative ? ?ROS:  ?Review of Systems  ?Constitutional: Negative.   ?HENT: Negative.    ?Eyes: Negative.   ?Respiratory: Negative.    ?Cardiovascular: Negative.   ?Gastrointestinal:  Positive for abdominal pain.  ?Genitourinary: Negative.   ?Musculoskeletal: Negative.   ?Skin: Negative.   ?Neurological: Negative.   ?Endo/Heme/Allergies: Negative.   ?Psychiatric/Behavioral: Negative.     ? ?Physical Exam: ?BP 136/75   Pulse 78   Temp 97.8 ?F (36.6 ?C) (Oral)   Resp 18   Ht 5\' 4"  (1.626 m)   Wt 86.4 kg   SpO2 99%   BMI 32.70 kg/m?   ?Physical Exam ?Vitals and nursing note reviewed.  ?Constitutional:   ?   Appearance: Normal appearance.  ?HENT:  ?   Head: Normocephalic and atraumatic.  ?   Nose: Nose normal.  ?   Mouth/Throat:  ?   Pharynx: Oropharynx is clear.  ?Eyes:  ?   Pupils: Pupils are equal, round, and reactive to light.  ?Cardiovascular:  ?   Rate and Rhythm: Normal rate.  ?   Pulses: Normal pulses.  ?   Heart sounds: Normal heart sounds.  ?Pulmonary:  ?   Effort: Pulmonary effort is normal.  ?   Breath sounds: Normal breath sounds.  ?Abdominal:  ?   General: Bowel sounds are normal.  ?Genitourinary: ?   Comments: Uterus gravida equal to dates, pelvis adequate.  ?Musculoskeletal:     ?   General: Normal range of motion.  ?   Cervical back: Normal range of motion and neck supple.  ?Skin: ?   General: Skin is warm.  ?   Capillary Refill: Capillary refill takes less than 2 seconds.  ?Neurological:  ?   General: No focal deficit present.  ?   Mental Status: She is alert.  ?Psychiatric:     ?   Mood and Affect: Mood normal.  ?  ? ?NST: FHR baseline 130 bpm, Variability: moderate, Accelerations:present, Decelerations:  Absent= Cat 1/Reactive ?UC:   irregular, every 2-5 minutes ?SVE:   Dilation:  8 ?Effacement (%): 50 ?Station: -3 ?Exam by:: [email or web address], CNM, vertex verified by fetal sutures.  ?Leopold's: Position vertex, EFW 6lbs via leopold's.  ? ?Labs: ?No results found for this or any previous visit (from the past 24 hour(s)). ? ?Imaging:  ?No  results found. ? ?MAU Course: ?Orders Placed This Encounter  ?Procedures  ? CBC  ? RPR  ? Diet clear liquid Room service appropriate? Yes; Fluid consistency: Thin  ? Vitals signs per unit policy  ? Notify physician (specify)  ? Fetal monitoring per unit policy  ? Activity as tolerated  ? Cervical Exam  ? Measure blood pressure post delivery every 15 min x 1 hour then every 30 min x 1 hour  ? Fundal check post delivery every 15 min x 1 hour then every 30 min x 1 hour  ? If Rapid HIV test positive or known HIV positive: initiate AZT orders  ? May in and out cath x 2 for inability to void  ? Insert urethral catheter X 1 PRN If Coude Catheter is chosen, qualified resources by campus can be found in the clinical skills nursing procedure for Coude Catheter 1. If straight catheterized > 2 times or patient unable to void post epidural plac...  ? Refer to Sidebar Report Urinary (Foley) Catheter Indications  ? Refer to Sidebar Report Post Indwelling Urinary Catheter Removal and Intervention Guidelines  ? Discontinue foley prior to vaginal delivery  ? Initiate Carrier Fluid Protocol  ? Initiate Oral Care Protocol  ? Patient may have epidural placement upon request  ? May use local infiltration of 1% lidocaine plain to produce a skin wheal prior to IV insertion  ? Notify in-house Anesthesia team of nausea and vomiting greater than 5 hours  ? Assess for signs/symptoms of PIH/preeclampsia  ? RN to place order for: CBC if one has not been drawn in the past 6 hours for all patients with hypertensive disease, pre-eclampsia, eclampsia, thrombocytopenia or previous PLTC<150,000.  ? Identify to Anesthesia if patient plans to have postpartum tubal ligation; do not remove epidural  without discussion with Anesthesiologist  ? Vital signs following Epidural Placement, re-bolus or re-dose monitor patient's BP and oxygen saturation every 5 minutes for 30 minutes  ? Pain Assessment Document numeric pain score  ? RN to

## 2021-09-02 NOTE — Lactation Note (Signed)
This note was copied from a baby's chart. ?Lactation Consultation Note ?Mom has given formula. Mom states she is going to do Breast and formula. Mom would like to be seen later today on MBU. ? ?Patient Name: Christy Moran ?Today's Date: 09/02/2021 ?  ?Age:23 hours ? ?Maternal Data ?  ? ?Feeding ?  ? ?LATCH Score ?  ? ?  ? ?  ? ?  ? ?  ? ?  ? ? ?Lactation Tools Discussed/Used ?  ? ?Interventions ?  ? ?Discharge ?  ? ?Consult Status ?  ? ? ? ?Charyl Dancer ?09/02/2021, 5:47 AM ? ? ? ?

## 2021-09-02 NOTE — Anesthesia Preprocedure Evaluation (Signed)
Anesthesia Evaluation  Patient identified by MRN, date of birth, ID band Patient awake    Reviewed: Allergy & Precautions, NPO status , Patient's Chart, lab work & pertinent test results  Airway Mallampati: II  TM Distance: >3 FB Neck ROM: Full    Dental no notable dental hx.    Pulmonary neg pulmonary ROS,    Pulmonary exam normal breath sounds clear to auscultation       Cardiovascular negative cardio ROS Normal cardiovascular exam Rhythm:Regular Rate:Normal     Neuro/Psych negative neurological ROS  negative psych ROS   GI/Hepatic negative GI ROS, Neg liver ROS,   Endo/Other  negative endocrine ROS  Renal/GU negative Renal ROS  negative genitourinary   Musculoskeletal negative musculoskeletal ROS (+)   Abdominal   Peds negative pediatric ROS (+)  Hematology  (+) Blood dyscrasia, anemia ,   Anesthesia Other Findings   Reproductive/Obstetrics negative OB ROS                             Anesthesia Physical Anesthesia Plan  ASA: 2  Anesthesia Plan: Epidural   Post-op Pain Management:    Induction:   PONV Risk Score and Plan: 2 and Treatment may vary due to age or medical condition  Airway Management Planned: Natural Airway  Additional Equipment: None  Intra-op Plan:   Post-operative Plan:   Informed Consent: I have reviewed the patients History and Physical, chart, labs and discussed the procedure including the risks, benefits and alternatives for the proposed anesthesia with the patient or authorized representative who has indicated his/her understanding and acceptance.       Plan Discussed with: Anesthesiologist  Anesthesia Plan Comments:         Anesthesia Quick Evaluation  

## 2021-09-02 NOTE — Lactation Note (Signed)
This note was copied from a baby's chart. ?Lactation Consultation Note ? ?Patient Name: Christy Moran ?Today's Date: 09/02/2021 ?  ?Age:23 hours ? ?Baby at 7 hours old at lactation visit. Mom holding baby in bed upon [facility] student entering room, baby sleeping.  ? ?Mom plans to formula feed. Mom reports baby is taking 15 mL of formula but still cueing after the feed. [facility] student reviewed paced bottle feeding and skin to skin.  ? ?Feeding Plan ?1 - Feed formula on demand 8+/24 hours. ?2 - Continue skin to skin.  ?3 - Answered all questions and concerns.  ? ?Shana Chute ?09/02/2021, 11:43 AM ? ? ? ?

## 2021-09-02 NOTE — Discharge Summary (Incomplete Revision)
?  SVD OB Discharge Summary ? ?   ?Patient Name: Christy Moran ?DOB: 11/02/2000 ?MRN: [id number] ? ?Date of admission: 09/02/2021 ?Delivering MD: Duanne Limerick, Zilah Villaflor  ?Date of delivery: 09/02/2021 ?Type of delivery: SVD ? ?Newborn Data: ?Sex: Baby female ?Live born female  ?Birth Weight: 6 lb 11.9 oz (3059 g) ?APGAR: 9, 9 ? ?Newborn Delivery   ?Birth date/time: 09/02/2021 04:09:00 ?Delivery type: Vaginal, Spontaneous ?  ?  ? ?Feeding:  bottle ?Infant being discharge to home with mother in stable condition.  ? ?Admitting diagnosis: Normal labor and delivery [O80] ?Intrauterine pregnancy: [redacted]w[redacted]d     ?Secondary diagnosis:  Principal Problem: ?  Normal labor and delivery ?Active Problems: ?  SVD (spontaneous vaginal delivery) ?  Normal postpartum course ?  Acute blood loss anemia ?  Elevated BP without diagnosis of hypertension ?                              ? ?Complications: None                                                              ?Intrapartum Procedures: spontaneous vaginal delivery ?Postpartum Procedures: none ?Complications-Operative and Postpartum:  1st not repaired degree perineal laceration ?Augmentation: AROM ? ? ?History of Present Illness: ?Christy Moran is a 21 y.o. female, [phone], who presents at [redacted]w[redacted]d weeks gestation. The patient has been followed at  [facility]  ?Her pregnancy has been complicated by: ? ?Patient Active Problem List  ? Diagnosis Date Noted  ? SVD (spontaneous vaginal delivery) 09/02/2021  ? Normal postpartum course 09/02/2021  ? Acute blood loss anemia 09/02/2021  ? Elevated BP without diagnosis of hypertension 09/02/2021  ? Normal labor and delivery 09/15/2020  ? Normal labor 07/17/2018  ? Encounter for supervision of normal first pregnancy in first trimester 02/14/2018  ?  ? ?Active Ambulatory Problems  ?  Diagnosis Date Noted  ? Encounter for supervision of normal first pregnancy in first trimester 02/14/2018  ? Normal labor 07/17/2018  ? Normal  labor and delivery 09/15/2020  ? ?Resolved Ambulatory Problems  ?  Diagnosis Date Noted  ? No Resolved Ambulatory Problems  ? ?Past Medical History:  ?Diagnosis Date  ? Anemia   ? Hx of gonorrhea   ?  ? ?Hospital course:  Onset of Labor With Vaginal Delivery      ?21 y.o. yo [id number] at [redacted]w[redacted]d was admitted in Active Labor on 09/02/2021. Patient had an uncomplicated labor course as follows:  ?Membrane Rupture Time/Date: 4:07 AM ,09/02/2021   ?Delivery Method:Vaginal, Spontaneous  ?Episiotomy:   ?Lacerations:  1st degree;Perineal  ?Patient had an uncomplicated postpartum course.  She is ambulating, tolerating a regular diet, passing flatus, and urinating well. Patient is discharged home in stable condition on 09/03/21. ? ?Newborn Data: ?Birth date:09/02/2021  ?Birth time:4:09 AM  ?Gender:Female  ?Living status:Living  ?Apgars:9 ,9  ?[id number] g  ?Postpartum Day # 2 : S/P NSVD due to pt was admitted on 3/25 in active labor, she progressed fast to complete, augmented with AROM, with meconium, had SVD on 3/25 over 1st degree not repaired, ebl was 800 with bogginess, was given tx, pit, methergine, and 1028mcg, hgb drop of 10-7.7, asymptomatic, received IV  venofer 3/26 now on po iron. Patient up ad lib, denies syncope or dizziness. Reports consuming regular diet without issues and denies N/V. Patient reports 0 bowel movement + passing flatus.  Denies issues with urination and reports bleeding is "lighter."  Patient is bottle feeding and reports going well.  Desires nexplanon for postpartum contraception.  Pain is being appropriately managed with use of po meds.  ? ?Physical exam  ?Vitals:  ? 09/02/21 1150 09/02/21 1550 09/02/21 2005 09/03/21 0424  ?BP: 122/67 120/66 (!) 108/51 104/79  ?Pulse: 66 60 74 66  ?Resp: 20 20 18 18   ?Temp: 98.4 ?F (36.9 ?C) 98.2 ?F (36.8 ?C) 98.2 ?F (36.8 ?C) 98 ?F (36.7 ?C)  ?TempSrc: Oral Oral Oral Oral  ?SpO2:   100% 100%  ?Weight:      ?Height:      ? ?General: alert, cooperative, and no  distress ?Lochia: appropriate ?Uterine Fundus: firm ?Perineum: approximate ?DVT Evaluation: No evidence of DVT seen on physical exam. ?Negative Homan's sign. ?No cords or calf tenderness. ?No significant calf/ankle edema. ? ?Labs: ?Lab Results  ?Component Value Date  ? WBC 7.6 09/03/2021  ? HGB 7.7 (L) 09/03/2021  ? HCT 24.6 (L) 09/03/2021  ? MCV 85.4 09/03/2021  ? PLT 153 09/03/2021  ? ? ?  Latest Ref Rng & Units 09/02/2021  ?  3:19 AM  ?CMP  ?Glucose 70 - 99 mg/dL 96    ?BUN 6 - 20 mg/dL 8    ?Creatinine 0.44 - 1.00 mg/dL 0.91    ?Sodium 135 - 145 mmol/L 136    ?Potassium 3.5 - 5.1 mmol/L 3.6    ?Chloride 98 - 111 mmol/L 107    ?CO2 22 - 32 mmol/L 20    ?Calcium 8.9 - 10.3 mg/dL 9.0    ?Total Protein 6.5 - 8.1 g/dL 6.7    ?Total Bilirubin 0.3 - 1.2 mg/dL 0.3    ?Alkaline Phos 38 - 126 U/L 99    ?AST 15 - 41 U/L 28    ?ALT 0 - 44 U/L 22    ? ? ?Date of discharge: 09/03/2021 ?Discharge Diagnoses: Term Pregnancy-delivered ?Discharge instruction: per After Visit Summary and "Baby and Me Booklet". ? ?After visit meds:  ? ?Activity:           unrestricted and pelvic rest Advance as tolerated. Pelvic rest for 6 weeks.  ?Diet:                routine ?Medications: PNV, Ibuprofen, Colace, and Iron ?Postpartum contraception: Undecided ?Condition:  Pt discharge to home with baby in stable condition ?ABLA: PO Iron  ? ?Meds: ?Allergies as of 09/03/2021   ?No Known Allergies ?  ? ?  ?Medication List  ?  ? ?STOP taking these medications   ? ?ondansetron 4 MG disintegrating tablet ?Commonly known as: ZOFRAN-ODT ?  ? ?  ? ?TAKE these medications   ? ?acetaminophen 325 MG tablet ?Commonly known as: Tylenol ?Take 2 tablets (650 mg total) by mouth every 4 (four) hours as needed (for pain scale < 4). ?  ?ibuprofen 600 MG tablet ?Commonly known as: ADVIL ?Take 1 tablet (600 mg total) by mouth every 6 (six) hours. ?  ?iron polysaccharides 150 MG capsule ?Commonly known as: NIFEREX ?Take 1 capsule (150 mg total) by mouth daily. ?Start taking  on: September 04, 2021 ?  ?prenatal multivitamin Tabs tablet ?Take 1 tablet by mouth daily at 12 noon. ?  ? ?  ? ? ?Discharge Follow Up:  ?  Follow-up Information   ? ? [facility]. Schedule an appointment as soon as possible for a visit in 6 week(s).   ?Specialty: Obstetrics and Gynecology ?Contact information: ?[address] ?[address] ?[place] [phone] ?[phone] ? ?  ?  ? ?  ?  ? ?  ?  ? ?Noralyn Pick, NP-C, CNM ?09/03/2021, 11:05 AM  ?Noralyn Pick, [place] ? ? ? ? ? ? ? ?

## 2021-09-02 NOTE — Discharge Summary (Addendum)
?  SVD OB Discharge Summary ? ?   ?Patient Name: Christy Moran ?DOB: [birth year] ?MRN: [id number] ? ?Date of admission: 09/02/2021 ?Delivering MD: Oliver Hum, Lexys Milliner  ?Date of delivery: 09/02/2021 ?Type of delivery: SVD ? ?Newborn Data: ?Sex: Baby female ?Live born female  ?Birth Weight: 6 lb 11.9 oz (3059 g) ?APGAR: 9, 9 ? ?Newborn Delivery   ?Birth date/time: 09/[birth year]/2021 04:09:00 ?Delivery type: Vaginal, Spontaneous ?  ?  ? ?Feeding:  bottle ?Infant being discharge to home with mother in stable condition.  ? ?Admitting diagnosis: Normal labor and delivery [O80] ?Intrauterine pregnancy: [redacted]w[redacted]d     ?Secondary diagnosis:  Principal Problem: ?  Normal labor and delivery ?Active Problems: ?  SVD (spontaneous vaginal delivery) ?  Normal postpartum course ?  Acute blood loss anemia ?  Elevated BP without diagnosis of hypertension ?                              ? ?Complications: None                                                              ?Intrapartum Procedures: spontaneous vaginal delivery ?Postpartum Procedures: none ?Complications-Operative and Postpartum:  1st not repaired degree perineal laceration ?Augmentation: AROM ? ? ?History of Present Illness: ?Christy Moran is a 21 y.o. female, [phone], who presents at [redacted]w[redacted]d weeks gestation. The patient has been followed at  [facility] Gynecology  ?Her pregnancy has been complicated by: ? ?Patient Active Problem List  ? Diagnosis Date Noted  ? SVD (spontaneous vaginal delivery) 09/02/2021  ? Normal postpartum course 09/02/2021  ? Acute blood loss anemia 09/02/2021  ? Elevated BP without diagnosis of hypertension 09/02/2021  ? Normal labor and delivery 09/15/2020  ? Normal labor 07/17/2018  ? Encounter for supervision of normal first pregnancy in first trimester 02/14/2018  ?  ? ?Active Ambulatory Problems  ?  Diagnosis Date Noted  ? Encounter for supervision of normal first pregnancy in first trimester 02/14/2018  ? Normal labor 07/17/2018  ? Normal  labor and delivery 09/15/2020  ? ?Resolved Ambulatory Problems  ?  Diagnosis Date Noted  ? No Resolved Ambulatory Problems  ? ?Past Medical History:  ?Diagnosis Date  ? Anemia   ? Hx of gonorrhea   ?  ? ?Hospital course:  Onset of Labor With Vaginal Delivery      ?21 y.o. yo [id number] at [redacted]w[redacted]d was admitted in Active Labor on 09/02/2021. Patient had an uncomplicated labor course as follows:  ?Membrane Rupture Time/Date: 4:07 AM ,09/02/2021   ?Delivery Method:Vaginal, Spontaneous  ?Episiotomy:   ?Lacerations:  1st degree;Perineal  ?Patient had an uncomplicated postpartum course.  She is ambulating, tolerating a regular diet, passing flatus, and urinating well. Patient is discharged home in stable condition on 09/04/21. ? ?Newborn Data: ?Birth date:09/02/2021  ?Birth time:4:09 AM  ?Gender:Female  ?Living status:Living  ?Apgars:9 ,9  ?Weight:3059 g  ?Postpartum Day # 2 : S/P NSVD due to pt was admitted on 3/25 in active labor, she progressed fast to complete, augmented with AROM, with meconium, had SVD on 3/25 over 1st degree not repaired, ebl was 800 with bogginess, was given tx, pit, methergine, and , hgb drop of 10-7.7, asymptomatic, received IV  venofer 3/26 now on po iron. Patient up ad lib, denies syncope or dizziness. Reports consuming regular diet without issues and denies N/V. Patient reports 0 bowel movement + passing flatus.  Denies issues with urination and reports bleeding is "lighter."  Patient is bottle feeding and reports going well.  Desires nexplanon for postpartum contraception.  Pain is being appropriately managed with use of po meds.  ? ?Physical exam  ?Vitals:  ? 09/03/21 0424 09/03/21 1448 09/03/21 2031 09/04/21 0517  ?BP: 104/79 117/73 118/69 (!) 110/43  ?Pulse: 66 67 82 72  ?Resp: 18 20 18 18   ?Temp: 98 ?F (36.7 ?C) 98.3 ?F (36.8 ?C) 97.7 ?F (36.5 ?C) 97.9 ?F (36.6 ?C)  ?TempSrc: Oral Oral Oral Oral  ?SpO2: 100%  100% 100%  ?Weight:      ?Height:      ? ?General: alert, cooperative, and no  distress ?Lochia: appropriate ?Uterine Fundus: firm ?Perineum: approximate ?DVT Evaluation: No evidence of DVT seen on physical exam. ?Negative Homan's sign. ?No cords or calf tenderness. ?No significant calf/ankle edema. ? ?Labs: ?Lab Results  ?Component Value Date  ? WBC 7.6 09/03/2021  ? HGB 7.7 (L) 09/03/2021  ? HCT 24.6 (L) 09/03/2021  ? MCV 85.4 09/03/2021  ? PLT 153 09/03/2021  ? ? ?  Latest Ref Rng & Units 09/02/2021  ?  3:19 AM  ?CMP  ?Glucose 70 - 99 mg/dL 96    ?BUN 6 - 20 mg/dL 8    ?Creatinine 0.44 - 1.00 mg/dL 1.610.91    ?Sodium 135 - 145 mmol/L 136    ?Potassium 3.5 - 5.1 mmol/L 3.6    ?Chloride 98 - 111 mmol/L 107    ?CO2 22 - 32 mmol/L 20    ?Calcium 8.9 - 10.3 mg/dL 9.0    ?Total Protein 6.5 - 8.1 g/dL 6.7    ?Total Bilirubin 0.3 - 1.2 mg/dL 0.3    ?Alkaline Phos 38 - 126 U/L 99    ?AST 15 - 41 U/L 28    ?ALT 0 - 44 U/L 22    ? ? ?Date of discharge: 09/04/2021 ?Discharge Diagnoses: Term Pregnancy-delivered ?Discharge instruction: per After Visit Summary and "Baby and Me Booklet". ? ?After visit meds:  ? ?Activity:           unrestricted and pelvic rest Advance as tolerated. Pelvic rest for 6 weeks.  ?Diet:                routine ?Medications: PNV, Ibuprofen, Colace, and Iron ?Postpartum contraception: Undecided ?Condition:  Pt discharge to home with baby in stable condition ?ABLA: PO Iron  ? ?Meds: ?Allergies as of 09/04/2021   ?No Known Allergies ?  ? ?  ?Medication List  ?  ? ?STOP taking these medications   ? ?ondansetron 4 MG disintegrating tablet ?Commonly known as: ZOFRAN-ODT ?  ? ?  ? ?TAKE these medications   ? ?acetaminophen 325 MG tablet ?Commonly known as: Tylenol ?Take 2 tablets (650 mg total) by mouth every 4 (four) hours as needed (for pain scale < 4). ?  ?ibuprofen 600 MG tablet ?Commonly known as: ADVIL ?Take 1 tablet (600 mg total) by mouth every 6 (six) hours. ?  ?iron polysaccharides 150 MG capsule ?Commonly known as: NIFEREX ?Take 1 capsule (150 mg total) by mouth daily. ?  ?prenatal  multivitamin Tabs tablet ?Take 1 tablet by mouth daily at 12 noon. ?  ? ?  ? ? ?Discharge Follow Up:  ? Follow-up Information   ? ?  [facility]. Schedule an appointment as soon as possible for a visit in 6 week(s).   ?Specialty: Obstetrics and Gynecology ?Contact information: ?[address] ?[address] ?[place] [address] ?[phone] ? ?  ?  ? ?  ?  ? ?  ?  ? ?Dale Keiser, NP-C, CNM ?09/04/2021, 9:29 AM  ?Dale Bexley, FNP ? ? ? ? ? ? ? ?

## 2021-09-02 NOTE — MAU Note (Signed)
PT SAYS UC STRONG SINCE 2330 ?P[facility] WITH [facility]  ?NO VE YESTERDAY  ?LAST WEEK - 1 CM ?DENIES HSV  ? GBS- NEG ?

## 2021-09-03 LAB — CBC
HCT: 24.6 % — ABNORMAL LOW (ref 36.0–46.0)
Hemoglobin: 7.7 g/dL — ABNORMAL LOW (ref 12.0–15.0)
MCH: 26.7 pg (ref 26.0–34.0)
MCHC: 31.3 g/dL (ref 30.0–36.0)
MCV: 85.4 fL (ref 80.0–100.0)
Platelets: 153 10*3/uL (ref 150–400)
RBC: 2.88 MIL/uL — ABNORMAL LOW (ref 3.87–5.11)
RDW: 14.2 % (ref 11.5–15.5)
WBC: 7.6 10*3/uL (ref 4.0–10.5)
nRBC: 0 % (ref 0.0–0.2)

## 2021-09-03 MED ORDER — SODIUM CHLORIDE 0.9 % IV SOLN
500.0000 mg | Freq: Once | INTRAVENOUS | Status: AC
  Administered 2021-09-03: 500 mg via INTRAVENOUS
  Filled 2021-09-03: qty 25

## 2021-09-03 MED ORDER — POLYSACCHARIDE IRON COMPLEX 150 MG PO CAPS: 150.0000 mg | ORAL_CAPSULE | Freq: Every day | ORAL | 1 refills | Status: AC

## 2021-09-03 MED ORDER — POLYSACCHARIDE IRON COMPLEX 150 MG PO CAPS
150.0000 mg | ORAL_CAPSULE | Freq: Every day | ORAL | Status: DC
  Administered 2021-09-04: 150 mg via ORAL
  Filled 2021-09-03: qty 1

## 2021-09-03 MED ORDER — IBUPROFEN 600 MG PO TABS: 600.0000 mg | ORAL_TABLET | Freq: Four times a day (QID) | ORAL | 0 refills | Status: DC

## 2021-09-03 NOTE — Progress Notes (Signed)
Subjective: ?Postpartum Day # 1 : S/P NSVD due to pt was admitted on 3/25 in active labor, she progressed fast to complete, augmented with AROM, with meconium, had SVD on 3/25 over 1st degree not repaired, ebl was 800 with bogginess, was given tx, pit, methergine, and , hgb drop of 10-7.7, asymptomatic , will received IV venofer then will go on po iron. Patient up ad lib, denies syncope or dizziness. Reports consuming regular diet without issues and denies N/V. Patient reports 0 bowel movement + passing flatus.  Denies issues with urination and reports bleeding is "lighter."  Patient is bottle feeding and reports going well.  Desires nexplanon for postpartum contraception.  Pain is being appropriately managed with use of po meds.  ? ?1st laceration not repaired ?Feeding:  bottle ?Contraceptive plan:  nexpalnon ?Baby female ? ?Objective: ?Vital signs in last 24 hours: ?Patient Vitals for the past 24 hrs: ? BP Temp Temp src Pulse Resp SpO2  ?09/03/21 1448 117/73 98.3 ?F (36.8 ?C) Oral 67 20 --  ?09/03/21 0424 104/79 98 ?F (36.7 ?C) Oral 66 18 100 %  ?09/02/21 2005 (!) 108/51 98.2 ?F (36.8 ?C) Oral 74 18 100 %  ?  ? ?Physical Exam:  ?General: alert, cooperative, and appears stated age ?Mood/Affect: happy ?Lungs: clear to auscultation, no wheezes, rales or rhonchi, symmetric air entry.  ?Heart: normal rate, regular rhythm, normal S1, S2, no murmurs, rubs, clicks or gallops. ?Breast: breasts appear normal, no suspicious masses, no skin or nipple changes or axillary nodes. ?Abdomen:  + bowel sounds, soft, non-tender ?GU: perineum approximate, healing well. No signs of external hematomas.  ?Uterine Fundus: firm ?Lochia: appropriate ?Skin: Warm, Dry. ?DVT Evaluation: No evidence of DVT seen on physical exam. ?Negative Homan's sign. ?No cords or calf tenderness. ?No significant calf/ankle edema. ? ? ?  Latest Ref Rng & Units 09/03/2021  ?  4:53 AM 09/02/2021  ?  3:19 AM 09/16/2020  ?  5:34 AM  ?CBC  ?WBC 4.0 - 10.5 K/uL  7.6   7.9   8.5    ?Hemoglobin 12.0 - 15.0 g/dL 7.7   78.5   8.8    ?Hematocrit 36.0 - 46.0 % 24.6   31.3   27.5    ?Platelets 150 - 400 K/uL 153   191   111    ? ? ?Results for orders placed or performed during the hospital encounter of 09/02/21 (from the past 24 hour(s))  ?CBC     Status: Abnormal  ? Collection Time: 09/03/21  4:53 AM  ?Result Value Ref Range  ? WBC 7.6 4.0 - 10.5 K/uL  ? RBC 2.88 (L) 3.87 - 5.11 MIL/uL  ? Hemoglobin 7.7 (L) 12.0 - 15.0 g/dL  ? HCT 24.6 (L) 36.0 - 46.0 %  ? MCV 85.4 80.0 - 100.0 fL  ? MCH 26.7 26.0 - 34.0 pg  ? MCHC 31.3 30.0 - 36.0 g/dL  ? RDW 14.2 11.5 - 15.5 %  ? Platelets 153 150 - 400 K/uL  ? nRBC 0.0 0.0 - 0.2 %  ?  ? ?CBG (last 3)  ?No results for input(s): GLUCAP in the last 72 hours.  ? ?I/O last 3 completed shifts: ?In: -  ?Out: 800 [Blood:800]  ? ?Assessment ?Postpartum Day # 1 : S/P NSVD due to pt was admitted on 3/25 in active labor, she progressed fast to complete, augmented with AROM, with meconium, had SVD on 3/25 over 1st degree not repaired, ebl was 800 with bogginess, was given  tx, pit, methergine, and , hgb drop of 10-7.7, asymptomatic , will received IV venofer then will go on po iron. Pt stable. -1 involution. Bottle feeding. Hemodynamically stable.  ? ?Plan: ?Continue other mgmt as ordered ?Anemia: IV venofer today, PO Iron tomorrow.  ?VTE prophylactics: Early ambulated as tolerates.  ?Pain control: Motrin/Tylenol PRN ?Education given regarding options for contraception, including barrier methods, injectable contraception, IUD placement, oral contraceptives.  ?Plan for discharge tomorrow and Contraception Nexplanon PP ? ?Dr. Richardson Dopp to be updated on patient status ? ?[facility] NP-C, CNM ?09/03/2021, 5:13 PM ?  ?

## 2021-09-13 ENCOUNTER — Telehealth (HOSPITAL_COMMUNITY): Payer: Self-pay | Admitting: *Deleted

## 2021-09-13 NOTE — Telephone Encounter (Signed)
Attempted Hospital Discharge Follow-Up Call.  Left voice mail requesting that patient return RN's phone call.  

## 2022-03-08 ENCOUNTER — Other Ambulatory Visit: Payer: Self-pay

## 2022-03-08 ENCOUNTER — Emergency Department (HOSPITAL_BASED_OUTPATIENT_CLINIC_OR_DEPARTMENT_OTHER)
Admission: EM | Admit: 2022-03-08 | Discharge: 2022-03-09 | Disposition: A | Discharge status: Home / Self Care | Payer: Medicaid Other | Attending: Emergency Medicine | Admitting: Emergency Medicine

## 2022-03-08 DIAGNOSIS — R1013 Epigastric pain: Secondary | ICD-10-CM | POA: Diagnosis not present

## 2022-03-08 DIAGNOSIS — R110 Nausea: Secondary | ICD-10-CM | POA: Insufficient documentation

## 2022-03-08 DIAGNOSIS — R42 Dizziness and giddiness: Secondary | ICD-10-CM | POA: Insufficient documentation

## 2022-03-08 MED ORDER — ALUM & MAG HYDROXIDE-SIMETH 200-200-20 MG/5ML PO SUSP
30.0000 mL | Freq: Once | ORAL | Status: AC
  Administered 2022-03-09: 30 mL via ORAL
  Filled 2022-03-08: qty 30

## 2022-03-08 MED ORDER — DICYCLOMINE HCL 10 MG PO CAPS
10.0000 mg | ORAL_CAPSULE | Freq: Once | ORAL | Status: AC
  Administered 2022-03-09: 10 mg via ORAL
  Filled 2022-03-08: qty 1

## 2022-03-08 NOTE — ED Triage Notes (Signed)
Pt c/o upper abdominal pain described as cramping for the past 3 days worsening today. S/s associated with lightheaded, dizziness, and nausea. Pt currently on menstrual cycle that started 5 days ago.

## 2022-03-09 ENCOUNTER — Emergency Department (HOSPITAL_BASED_OUTPATIENT_CLINIC_OR_DEPARTMENT_OTHER): Payer: Medicaid Other

## 2022-03-09 LAB — COMPREHENSIVE METABOLIC PANEL
ALT: 20 U/L (ref 0–44)
AST: 16 U/L (ref 15–41)
Albumin: 3.6 g/dL (ref 3.5–5.0)
Alkaline Phosphatase: 42 U/L (ref 38–126)
Anion gap: 7 (ref 5–15)
BUN: 15 mg/dL (ref 6–20)
CO2: 25 mmol/L (ref 22–32)
Calcium: 8.9 mg/dL (ref 8.9–10.3)
Chloride: 105 mmol/L (ref 98–111)
Creatinine, Ser: 0.86 mg/dL (ref 0.44–1.00)
GFR, Estimated: >60 mL/min (ref >60)
Glucose, Bld: 91 mg/dL (ref 70–99)
Potassium: 3.5 mmol/L (ref 3.5–5.1)
Sodium: 137 mmol/L (ref 135–145)
Total Bilirubin: 0.4 mg/dL (ref 0.3–1.2)
Total Protein: 7 g/dL (ref 6.5–8.1)

## 2022-03-09 LAB — CBC WITH DIFFERENTIAL/PLATELET
Abs Immature Granulocytes: 0.03 10*3/uL (ref 0.00–0.07)
Basophils Absolute: 0 10*3/uL (ref 0.0–0.1)
Basophils Relative: 0 %
Eosinophils Absolute: 0.1 10*3/uL (ref 0.0–0.5)
Eosinophils Relative: 1 %
HCT: 33.7 % — ABNORMAL LOW (ref 36.0–46.0)
Hemoglobin: 10.7 g/dL — ABNORMAL LOW (ref 12.0–15.0)
Immature Granulocytes: 0 %
Lymphocytes Relative: 28 %
Lymphs Abs: 2.3 10*3/uL (ref 0.7–4.0)
MCH: 26.5 pg (ref 26.0–34.0)
MCHC: 31.8 g/dL (ref 30.0–36.0)
MCV: 83.4 fL (ref 80.0–100.0)
Monocytes Absolute: 0.5 10*3/uL (ref 0.1–1.0)
Monocytes Relative: 7 %
Neutro Abs: 5.1 10*3/uL (ref 1.7–7.7)
Neutrophils Relative %: 64 %
Platelets: 167 10*3/uL (ref 150–400)
RBC: 4.04 MIL/uL (ref 3.87–5.11)
RDW: 17.3 % — ABNORMAL HIGH (ref 11.5–15.5)
WBC: 8 10*3/uL (ref 4.0–10.5)
nRBC: 0 % (ref 0.0–0.2)

## 2022-03-09 LAB — PREGNANCY, URINE: Preg Test, Ur: NEGATIVE

## 2022-03-09 MED ORDER — OMEPRAZOLE 20 MG PO CPDR: 20.0000 mg | DELAYED_RELEASE_CAPSULE | Freq: Every day | ORAL | 0 refills | Status: AC

## 2022-03-09 NOTE — ED Provider Notes (Signed)
[facility] Provider Note   CSN: [id number] Arrival date & time: 03/08/22  2326     History  Chief Complaint  Patient presents with   Abdominal Pain    Christy Moran is a 21 y.o. female.  The history is provided by the patient.  Abdominal Pain Pain location:  Epigastric and LUQ Pain quality: cramping   Pain radiates to:  Does not radiate Pain severity:  Moderate Onset quality:  Gradual Duration:  3 days Timing:  Constant Progression:  Unchanged Chronicity:  New Context: not retching and not trauma   Relieved by:  Nothing Worsened by:  Nothing Ineffective treatments:  None tried Associated symptoms: no cough, no diarrhea, no fever, no flatus and no vaginal bleeding   Risk factors: no alcohol abuse   Today felt nausea and lightheaded with the pain.  No fevers no chills.       Home Medications Prior to Admission medications   Medication Sig Start Date End Date Taking? Authorizing Provider  omeprazole (PRILOSEC) 20 MG capsule Take 1 capsule (20 mg total) by mouth daily. 03/09/22  Yes Lashaun Krapf, MD  acetaminophen (TYLENOL) 325 MG tablet Take 2 tablets (650 mg total) by mouth every 4 (four) hours as needed (for pain scale < 4). 09/16/20   Crumpler, Anderson Malta B, CNM  ibuprofen (ADVIL) 600 MG tablet Take 1 tablet (600 mg total) by mouth every 6 (six) hours. 09/03/21   Noralyn Pick, FNP  iron polysaccharides (NIFEREX) 150 MG capsule Take 1 capsule (150 mg total) by mouth daily. 09/04/21   Noralyn Pick, [place]  Prenatal Vit-Fe Fumarate-FA (PRENATAL MULTIVITAMIN) TABS tablet Take 1 tablet by mouth daily at 12 noon.    [provider]      Allergies    Patient has no known allergies.    Review of Systems   Review of Systems  Constitutional:  Negative for fever.  HENT:  Negative for facial swelling.   Eyes:  Negative for redness.  Respiratory:  Negative for cough.   Gastrointestinal:  Positive for abdominal pain. Negative for  diarrhea and flatus.  Genitourinary:  Negative for vaginal bleeding.  Neurological:  Negative for facial asymmetry.  All other systems reviewed and are negative.   Physical Exam Updated Vital Signs BP 125/81 (BP Location: Right Arm)   Pulse (!) 101   Temp 99.3 F (37.4 C) (Oral)   Resp 19   Ht 5\' 4"  (1.626 m)   Wt 72.6 kg   SpO2 100%   BMI 27.46 kg/m  Physical Exam Vitals and nursing note reviewed.  Constitutional:      General: She is not in acute distress.    Appearance: Normal appearance. She is well-developed.  HENT:     Head: Normocephalic and atraumatic.     Nose: Nose normal.  Eyes:     Pupils: Pupils are equal, round, and reactive to light.  Cardiovascular:     Rate and Rhythm: Normal rate and regular rhythm.     Pulses: Normal pulses.     Heart sounds: Normal heart sounds.  Pulmonary:     Effort: Pulmonary effort is normal. No respiratory distress.     Breath sounds: Normal breath sounds.  Abdominal:     General: Bowel sounds are normal. There is no distension.     Palpations: Abdomen is soft.     Tenderness: There is no abdominal tenderness. There is no guarding or rebound.  Genitourinary:    Vagina: No vaginal discharge.  Musculoskeletal:        General: Normal range of motion.     Cervical back: Neck supple.  Skin:    General: Skin is warm and dry.     Capillary Refill: Capillary refill takes less than 2 seconds.     Findings: No erythema or rash.  Neurological:     General: No focal deficit present.     Mental Status: She is alert and oriented to person, place, and time.     Deep Tendon Reflexes: Reflexes normal.  Psychiatric:        Mood and Affect: Mood normal.        Behavior: Behavior normal.     ED Results / Procedures / Treatments   Labs (all labs ordered are listed, but only abnormal results are displayed) Results for orders placed or performed during the hospital encounter of 03/08/22  Pregnancy, urine  Result Value Ref Range   Preg  Test, Ur NEGATIVE NEGATIVE  CBC with Differential  Result Value Ref Range   WBC 8.0 4.0 - 10.5 K/uL   RBC 4.04 3.87 - 5.11 MIL/uL   Hemoglobin 10.7 (L) 12.0 - 15.0 g/dL   HCT 38.7 (L) 56.4 - 33.2 %   MCV 83.4 80.0 - 100.0 fL   MCH 26.5 26.0 - 34.0 pg   MCHC 31.8 30.0 - 36.0 g/dL   RDW 95.1 (H) 88.4 - 16.6 %   Platelets 167 150 - 400 K/uL   nRBC 0.0 0.0 - 0.2 %   Neutrophils Relative % 64 %   Neutro Abs 5.1 1.7 - 7.7 K/uL   Lymphocytes Relative 28 %   Lymphs Abs 2.3 0.7 - 4.0 K/uL   Monocytes Relative 7 %   Monocytes Absolute 0.5 0.1 - 1.0 K/uL   Eosinophils Relative 1 %   Eosinophils Absolute 0.1 0.0 - 0.5 K/uL   Basophils Relative 0 %   Basophils Absolute 0.0 0.0 - 0.1 K/uL   Immature Granulocytes 0 %   Abs Immature Granulocytes 0.03 0.00 - 0.07 K/uL  Comprehensive metabolic panel  Result Value Ref Range   Sodium 137 135 - 145 mmol/L   Potassium 3.5 3.5 - 5.1 mmol/L   Chloride 105 98 - 111 mmol/L   CO2 25 22 - 32 mmol/L   Glucose, Bld 91 70 - 99 mg/dL   BUN 15 6 - 20 mg/dL   Creatinine, Ser 0.63 0.44 - 1.00 mg/dL   Calcium 8.9 8.9 - 01.6 mg/dL   Total Protein 7.0 6.5 - 8.1 g/dL   Albumin 3.6 3.5 - 5.0 g/dL   AST 16 15 - 41 U/L   ALT 20 0 - 44 U/L   Alkaline Phosphatase 42 38 - 126 U/L   Total Bilirubin 0.4 0.3 - 1.2 mg/dL   GFR, Estimated >01 >09 mL/min   Anion gap 7 5 - 15   CT Renal Stone Study  Result Date: 03/09/2022 CLINICAL DATA:  Upper abdominal pain for 3 days; kidney stones suspected EXAM: CT ABDOMEN AND PELVIS WITHOUT CONTRAST TECHNIQUE: Multidetector CT imaging of the abdomen and pelvis was performed following the standard protocol without IV contrast. RADIATION DOSE REDUCTION: This exam was performed according to the departmental dose-optimization program which includes automated exposure control, adjustment of the mA and/or kV according to patient size and/or use of iterative reconstruction technique. COMPARISON:  ultrasound earlier today FINDINGS: Lower  chest: No acute abnormality. Hepatobiliary: Hypoattenuating lesion in the posterior right hepatic lobe corresponds with probable hemangioma on ultrasound earlier  today. Normal gallbladder. No biliary ductal dilation. Pancreas: Unremarkable. No pancreatic ductal dilatation or surrounding inflammatory changes. Spleen: Normal in size without focal abnormality. Adrenals/Urinary Tract: Unremarkable adrenal glands and kidneys. No urinary calculi or hydronephrosis. Bladder is unremarkable for degree of distention. Stomach/Bowel: Stomach is within normal limits. Appendix appears normal. No evidence of bowel wall thickening, distention, or inflammatory changes. Vascular/Lymphatic: No significant vascular findings are present. No enlarged abdominal or pelvic lymph nodes. Reproductive: Retroverted uterus.  No adnexal mass. Other: No free intraperitoneal fluid or air. Musculoskeletal: No acute or significant osseous findings. IMPRESSION: Unremarkable noncontrast CT of the abdomen and pelvis. Electronically Signed   By: Minerva Fester M.D.   On: 03/09/2022 01:39   US Abdomen Limited  Result Date: 03/09/2022 CLINICAL DATA:  Right upper quadrant pain EXAM: ULTRASOUND ABDOMEN LIMITED RIGHT UPPER QUADRANT COMPARISON:  None Available. FINDINGS: Gallbladder: No gallstones or wall thickening visualized. No sonographic Murphy sign noted by sonographer. Common bile duct: Diameter: 3.4 mm.  No intrahepatic biliary dilation. Liver: Hyperechoic lesion measuring 1.9 x 1.5 x 1.6 cm. Within normal limits in parenchymal echogenicity. Portal vein is patent on color Doppler imaging with normal direction of blood flow towards the liver. Other: None. IMPRESSION: Nothing for cholecystitis. Hyperechoic 1.9 cm liver lesion. In an asymptomatic patient without history of malignancy or risk factors for liver tumors this is consistent with hemangioma and no additional follow-up is required. Electronically Signed   By: Minerva Fester M.D.   On:  03/09/2022 01:08     Radiology CT Renal Stone Study  Result Date: 03/09/2022 CLINICAL DATA:  Upper abdominal pain for 3 days; kidney stones suspected EXAM: CT ABDOMEN AND PELVIS WITHOUT CONTRAST TECHNIQUE: Multidetector CT imaging of the abdomen and pelvis was performed following the standard protocol without IV contrast. RADIATION DOSE REDUCTION: This exam was performed according to the departmental dose-optimization program which includes automated exposure control, adjustment of the mA and/or kV according to patient size and/or use of iterative reconstruction technique. COMPARISON:  ultrasound earlier today FINDINGS: Lower chest: No acute abnormality. Hepatobiliary: Hypoattenuating lesion in the posterior right hepatic lobe corresponds with probable hemangioma on ultrasound earlier today. Normal gallbladder. No biliary ductal dilation. Pancreas: Unremarkable. No pancreatic ductal dilatation or surrounding inflammatory changes. Spleen: Normal in size without focal abnormality. Adrenals/Urinary Tract: Unremarkable adrenal glands and kidneys. No urinary calculi or hydronephrosis. Bladder is unremarkable for degree of distention. Stomach/Bowel: Stomach is within normal limits. Appendix appears normal. No evidence of bowel wall thickening, distention, or inflammatory changes. Vascular/Lymphatic: No significant vascular findings are present. No enlarged abdominal or pelvic lymph nodes. Reproductive: Retroverted uterus.  No adnexal mass. Other: No free intraperitoneal fluid or air. Musculoskeletal: No acute or significant osseous findings. IMPRESSION: Unremarkable noncontrast CT of the abdomen and pelvis. Electronically Signed   By: Minerva Fester M.D.   On: 03/09/2022 01:39   US Abdomen Limited  Result Date: 03/09/2022 CLINICAL DATA:  Right upper quadrant pain EXAM: ULTRASOUND ABDOMEN LIMITED RIGHT UPPER QUADRANT COMPARISON:  None Available. FINDINGS: Gallbladder: No gallstones or wall thickening visualized.  No sonographic Murphy sign noted by sonographer. Common bile duct: Diameter: 3.4 mm.  No intrahepatic biliary dilation. Liver: Hyperechoic lesion measuring 1.9 x 1.5 x 1.6 cm. Within normal limits in parenchymal echogenicity. Portal vein is patent on color Doppler imaging with normal direction of blood flow towards the liver. Other: None. IMPRESSION: Nothing for cholecystitis. Hyperechoic 1.9 cm liver lesion. In an asymptomatic patient without history of malignancy or risk factors for liver tumors  this is consistent with hemangioma and no additional follow-up is required. Electronically Signed   By: Minerva Fester M.D.   On: 03/09/2022 01:08    Procedures Procedures    Medications Ordered in ED Medications  alum & mag hydroxide-simeth (MAALOX/MYLANTA) 200-200-20 MG/5ML suspension 30 mL (30 mLs Oral Given 03/09/22 0024)  dicyclomine (BENTYL) capsule 10 mg (10 mg Oral Given 03/09/22 0024)    ED Course/ Medical Decision Making/ A&P                           Medical Decision Making Upper abdominal with nausea   Amount and/or Complexity of Data Reviewed External Data Reviewed: notes.    Details: Previous notes reviewed  Labs: ordered.    Details: All labs reviewed: negative pregnancy test, normal sodium 137, potassium 3.5 normal creatinine .86 normal LFTS.  Normal white count 8, low hemoglobin 10.7, normal platelets 167 Radiology: ordered and independent interpretation performed.    Details: Normal CT by me [place] normal by me   Risk OTC drugs. Prescription drug management. Risk Details: I suspect this is gerd as all labs and imaging are normal.  I have recommended a GERD friendly diet and will start omeprazole.  Will refer to GI for ongoing care.  Strict return    Final Clinical Impression(s) / ED Diagnoses Final diagnoses:  Abdominal pain, epigastric   Return for intractable cough, coughing up blood, fevers > 100.4 unrelieved by medication, shortness of breath, intractable vomiting,  chest pain, shortness of breath, weakness, numbness, changes in speech, facial asymmetry, abdominal pain, passing out, Inability to tolerate liquids or food, cough, altered mental status or any concerns. No signs of systemic illness or infection. The patient is nontoxic-appearing on exam and vital signs are within normal limits.  I have reviewed the triage vital signs and the nursing notes. Pertinent labs & imaging results that were available during my care of the patient were reviewed by me and considered in my medical decision making (see chart for details). After history, exam, and medical workup I feel the patient has been appropriately medically screened and is safe for discharge home. Pertinent diagnoses were discussed with the patient. Patient was given return precautions.  Rx / DC Orders ED Discharge Orders          Ordered    omeprazole (PRILOSEC) 20 MG capsule  Daily        03/09/22 0146              Gerrod Maule, MD 03/09/22 0151

## 2022-10-28 ENCOUNTER — Emergency Department (HOSPITAL_BASED_OUTPATIENT_CLINIC_OR_DEPARTMENT_OTHER)
Admission: EM | Admit: 2022-10-28 | Discharge: 2022-10-28 | Disposition: A | Discharge status: Home / Self Care | Payer: Medicaid Other | Attending: Emergency Medicine | Admitting: Emergency Medicine

## 2022-10-28 ENCOUNTER — Other Ambulatory Visit: Payer: Self-pay

## 2022-10-28 ENCOUNTER — Encounter (HOSPITAL_BASED_OUTPATIENT_CLINIC_OR_DEPARTMENT_OTHER): Payer: Self-pay | Admitting: Emergency Medicine

## 2022-10-28 DIAGNOSIS — R590 Localized enlarged lymph nodes: Secondary | ICD-10-CM | POA: Diagnosis not present

## 2022-10-28 DIAGNOSIS — J020 Streptococcal pharyngitis: Secondary | ICD-10-CM | POA: Insufficient documentation

## 2022-10-28 DIAGNOSIS — J029 Acute pharyngitis, unspecified: Secondary | ICD-10-CM | POA: Diagnosis present

## 2022-10-28 LAB — GROUP A STREP BY PCR: Group A Strep by PCR: DETECTED — AB

## 2022-10-28 MED ORDER — PENICILLIN G BENZATHINE 1200000 UNIT/2ML IM SUSY
1.2000 10*6.[IU] | PREFILLED_SYRINGE | Freq: Once | INTRAMUSCULAR | Status: AC
  Administered 2022-10-28: 1.2 10*6.[IU] via INTRAMUSCULAR
  Filled 2022-10-28: qty 2

## 2022-10-28 NOTE — ED Provider Notes (Signed)
EMERGENCY DEPARTMENT AT [facility] Provider Note   CSN: [id number] Arrival date & time: 10/28/22  1109     History  Chief Complaint  Patient presents with   Sore Throat    Christy Moran is a 22 y.o. female.  Patient presents emergency department today for evaluation of sore throat x 2 days.  She reports that her daughter is sick at home with a viral illness.  Patient reports subjective fever, body aches, chills and sore throat, worse yesterday.  She has been taking Tylenol at home with improvement.  No vomiting or diarrhea.  No cough.  Denies any major medical problems.       Home Medications Prior to Admission medications   Medication Sig Start Date End Date Taking? Authorizing Provider  acetaminophen (TYLENOL) 325 MG tablet Take 2 tablets (650 mg total) by mouth every 4 (four) hours as needed (for pain scale < 4). 09/16/20   Crumpler, Victorino Dike B, CNM  ibuprofen (ADVIL) 600 MG tablet Take 1 tablet (600 mg total) by mouth every 6 (six) hours. 09/03/21   Dale Bertram, FNP  iron polysaccharides (NIFEREX) 150 MG capsule Take 1 capsule (150 mg total) by mouth daily. 09/04/21   Dale , FNP  omeprazole (PRILOSEC) 20 MG capsule Take 1 capsule (20 mg total) by mouth daily. 03/09/22   Palumbo, April, MD  Prenatal Vit-Fe Fumarate-FA (PRENATAL MULTIVITAMIN) TABS tablet Take 1 tablet by mouth daily at 12 noon.    [provider]      Allergies    Patient has no known allergies.    Review of Systems   Review of Systems  Physical Exam Updated Vital Signs BP 117/72 (BP Location: Left Arm)   Pulse 96   Temp 98.8 F (37.1 C) (Oral)   Resp 18   Ht 5\' 4"  (1.626 m)   Wt 72.6 kg   LMP 10/24/2022   SpO2 99%   BMI 27.46 kg/m  Physical Exam Vitals and nursing note reviewed.  Constitutional:      Appearance: She is well-developed.  HENT:     Head: Normocephalic and atraumatic.     Jaw: No trismus.     Right Ear: Tympanic membrane, ear canal and  external ear normal.     Left Ear: Tympanic membrane, ear canal and external ear normal.     Nose: Nose normal. No mucosal edema or rhinorrhea.     Mouth/Throat:     Mouth: Mucous membranes are moist. Mucous membranes are not dry. No oral lesions.     Pharynx: Uvula midline. Posterior oropharyngeal erythema present. No oropharyngeal exudate or uvula swelling.     Tonsils: No tonsillar abscesses.  Eyes:     General:        Right eye: No discharge.        Left eye: No discharge.     Conjunctiva/sclera: Conjunctivae normal.  Cardiovascular:     Rate and Rhythm: Normal rate and regular rhythm.     Heart sounds: Normal heart sounds.  Pulmonary:     Effort: Pulmonary effort is normal. No respiratory distress.     Breath sounds: Normal breath sounds. No wheezing or rales.  Abdominal:     Palpations: Abdomen is soft.     Tenderness: There is no abdominal tenderness.  Musculoskeletal:     Cervical back: Normal range of motion and neck supple.  Lymphadenopathy:     Cervical: Cervical adenopathy present.  Skin:    General: Skin is warm  and dry.  Neurological:     Mental Status: She is alert.  Psychiatric:        Mood and Affect: Mood normal.     ED Results / Procedures / Treatments   Labs (all labs ordered are listed, but only abnormal results are displayed) Labs Reviewed  GROUP A STREP BY PCR    EKG None  Radiology No results found.  Procedures Procedures    Medications Ordered in ED Medications - No data to display  ED Course/ Medical Decision Making/ A&P    Patient seen and examined. History obtained directly from patient.   Labs/EKG: Ordered strep swab.  Imaging: None ordered Medications/Fluids: None ordered  Most recent vital signs reviewed and are as follows: BP 117/72 (BP Location: Left Arm)   Pulse 96   Temp 98.8 F (37.1 C) (Oral)   Resp 18   Ht 5\' 4"  (1.626 m)   Wt 72.6 kg   LMP 10/24/2022   SpO2 99%   BMI 27.46 kg/m   Initial impression:  Pharyngitis  12:56 PM Reassessment performed. Patient appears stable.  Labs personally reviewed and interpreted including: Strep test was positive  Reviewed pertinent lab work and imaging with patient at bedside. Questions answered.   Most current vital signs reviewed and are as follows: BP 117/72 (BP Location: Left Arm)   Pulse 96   Temp 98.8 F (37.1 C) (Oral)   Resp 18   Ht 5\' 4"  (1.626 m)   Wt 72.6 kg   LMP 10/24/2022   SpO2 99%   BMI 27.46 kg/m   Plan: Discharge to home.  Patient offered IM Bicillin versus p.o. amoxicillin, she would like the IM injection.  Prescriptions written for: None  Other home care instructions discussed: OTC meds, hydration  ED return instructions discussed: Worsening pain, trouble swallowing, new symptoms or other concerns  Follow-up instructions discussed: Patient encouraged to follow-up with their PCP in 5 days if not improving.                             Medical Decision Making Risk Prescription drug management.   In regards to the patient's sore throat today, the following dangerous and potentially life threatening etiologies were considered on the differential diagnosis: Lugwig's angina, uvulitis, epiglottis, peritonsillar abscess, retropharyngeal abscess, Lemierre's syndrome. Also considered were more common causes such as: streptococcal pharyngitis, gonococcal pharyngitis, non-bacterial pharyngitis (cold viruses, HSV/coxsackievirus, influenza, COVID-19, infectious mononucleosis, oropharyngeal candidiasis), and other non-infectious causes including seasonal allergies/post-nasal drip, GERD/esophagitis, trauma.   The patient's vital signs, pertinent lab work and imaging were reviewed and interpreted as discussed in the ED course. Hospitalization was considered for further testing, treatments, or serial exams/observation. However as patient is well-appearing, has a stable exam, and reassuring studies today, I do not feel that they warrant  admission at this time. This plan was discussed with the patient who verbalizes agreement and comfort with this plan and seems reliable and able to return to the Emergency Department with worsening or changing symptoms.          Final Clinical Impression(s) / ED Diagnoses Final diagnoses:  Strep throat    Rx / DC Orders ED Discharge Orders     None         Renne Crigler, PA-C 10/28/22 1257    Tegeler, Canary Brim, MD 10/28/22 1416

## 2022-10-28 NOTE — Discharge Instructions (Addendum)
Please read and follow all provided instructions.  Your diagnoses today include:  1. Strep throat     Tests performed today include: Strep test: was POSITIVE for strep throat Vital signs. See below for your results today.   Medications prescribed:  You were given a one-time shot of penicillin to treat your strep throat.   Take any medications prescribed only as directed.   Home care instructions:  Please read the educational materials provided and follow any instructions contained in this packet.  Please use over-the-counter NSAID medications (ibuprofen, naproxen) or Tylenol (acetaminophen) as directed on the packaging for pain -- as long as you do not have any reasons avoid these medications. Reasons to avoid NSAID medications include: weak kidneys, a history of bleeding in your stomach or gut, or uncontrolled high blood pressure or previous heart attack. Reasons to avoid Tylenol include: liver problems or ongoing alcohol use. Never take more than 4000mg  or 8 Extra strength Tylenol in a 24 hour period.     Follow-up instructions: Please follow-up with your primary care provider as needed for further evaluation of your symptoms.  Return instructions:  Please return to the Emergency Department if you experience worsening symptoms.  Return if you have worsening problems swallowing, your neck becomes swollen, you cannot swallow your saliva or your voice becomes muffled.  Return with high persistent fever, persistent vomiting, or if you have trouble breathing.  Please return if you have any other emergent concerns.  Additional Information:  Your vital signs today were: BP 117/72 (BP Location: Left Arm)   Pulse 96   Temp 98.8 F (37.1 C) (Oral)   Resp 18   Ht 5\' 4"  (1.626 m)   Wt 72.6 kg   LMP 10/24/2022   SpO2 99%   BMI 27.46 kg/m  If your blood pressure (BP) was elevated above 135/85 this visit, please have this repeated by your doctor within one month. --------------

## 2022-10-28 NOTE — ED Notes (Signed)
Discharge paperwork reviewed entirely with patient, including follow up care. Pain was under control. No prescriptions were called in, but all questions were addressed.  Pt verbalized understanding as well as all parties involved. No questions or concerns voiced at the time of discharge. No acute distress noted.   Pt ambulated out to PVA without incident or assistance.  

## 2022-10-28 NOTE — ED Triage Notes (Signed)
Pt w/ sore throat since Fri

## 2022-11-19 ENCOUNTER — Other Ambulatory Visit: Payer: Self-pay

## 2022-11-19 ENCOUNTER — Encounter (HOSPITAL_BASED_OUTPATIENT_CLINIC_OR_DEPARTMENT_OTHER): Payer: Self-pay | Admitting: Emergency Medicine

## 2022-11-19 ENCOUNTER — Emergency Department (HOSPITAL_BASED_OUTPATIENT_CLINIC_OR_DEPARTMENT_OTHER)
Admission: EM | Admit: 2022-11-19 | Discharge: 2022-11-19 | Disposition: A | Discharge status: Home / Self Care | Payer: Medicaid Other | Attending: Emergency Medicine | Admitting: Emergency Medicine

## 2022-11-19 DIAGNOSIS — R21 Rash and other nonspecific skin eruption: Secondary | ICD-10-CM | POA: Insufficient documentation

## 2022-11-19 MED ORDER — TRIAMCINOLONE ACETONIDE 0.1 % EX CREA: 1.0000 | TOPICAL_CREAM | Freq: Two times a day (BID) | CUTANEOUS | 0 refills | Status: AC

## 2022-11-19 NOTE — Discharge Instructions (Addendum)
It was a pleasure taking care of you today!  Ensure to keep skin moisturized, you may use over-the-counter Aveeno or Eucerin cream.  Use the Triamcinolone cream to the body for no more than 5 days. It is important that you do not scratch/pick at the area. Attached is information for the Dermatologist, call and set up a follow up appointment regarding todays ED visit. You should be notified of an upcoming primary care appointment, you may review this on MyChart as it will be set up by the Case Manager. Return to the ED if you are experiencing increasing/worsening symptoms.

## 2022-11-19 NOTE — ED Provider Notes (Signed)
[facility] Provider Note   CSN: [id number] Arrival date & time: 11/19/22  2130     History  Chief Complaint  Patient presents with   Rash    Christy Moran is a 22 y.o. female who presents to the ED with concerns for rash noted to hands onset 11/10/22. She reports that she had a gel manicure completed at that time and then noticed peeling of her hands following. Denies it being a new nail salon to her. No new manicure treatment to her knowledge. Notes after washing her hands or taking a shower the peeling is worse. Doesn't have a PCP at this time. Denies fever, drainage, or redness. No new medications, lotions, soaps, detergents. The area isn't pruritic. The area is slightly painful at the base of her nails. Has tried to keep the area moisturized.   The history is provided by the patient. No language interpreter was used.       Home Medications Prior to Admission medications   Medication Sig Start Date End Date Taking? Authorizing Provider  triamcinolone cream (KENALOG) 0.1 % Apply 1 Application topically 2 (two) times daily. 11/19/22  Yes Tytianna Greenley A, PA-C  iron polysaccharides (NIFEREX) 150 MG capsule Take 1 capsule (150 mg total) by mouth daily. 09/04/21   Dale [place], FNP  omeprazole (PRILOSEC) 20 MG capsule Take 1 capsule (20 mg total) by mouth daily. 03/09/22   Palumbo, April, MD  Prenatal Vit-Fe Fumarate-FA (PRENATAL MULTIVITAMIN) TABS tablet Take 1 tablet by mouth daily at 12 noon.    [provider]      Allergies    Patient has no known allergies.    Review of Systems   Review of Systems  Skin:  Positive for rash.  All other systems reviewed and are negative.   Physical Exam Updated Vital Signs BP 115/84   Pulse 69   Temp 97.8 F (36.6 C) (Oral)   Resp 17   Ht 5\' 4"  (1.626 m)   Wt 72.6 kg   LMP 10/24/2022   SpO2 100%   BMI 27.46 kg/m  Physical Exam Vitals and nursing note reviewed.   Constitutional:      General: She is not in acute distress.    Appearance: Normal appearance.  Eyes:     General: No scleral icterus.    Extraocular Movements: Extraocular movements intact.  Cardiovascular:     Rate and Rhythm: Normal rate.  Pulmonary:     Effort: Pulmonary effort is normal. No respiratory distress.  Abdominal:     Palpations: Abdomen is soft. There is no mass.     Tenderness: There is no abdominal tenderness.  Musculoskeletal:        General: Normal range of motion.     Cervical back: Neck supple.     Comments: Able to move all extremities without assistance or difficulty.   Skin:    General: Skin is warm and dry.     Findings: No rash.     Comments: Peeling of skin noted diffusely to hands on palmar and dorsal aspect. No sloughing noted. No erythema, TTP, or drainage noted.   Neurological:     Mental Status: She is alert.     Sensory: Sensation is intact.     Motor: Motor function is intact.  Psychiatric:        Behavior: Behavior normal.     ED Results / Procedures / Treatments   Labs (all labs ordered are listed, but  only abnormal results are displayed) Labs Reviewed - No data to display  EKG None  Radiology No results found.  Procedures Procedures    Medications Ordered in ED Medications - No data to display  ED Course/ Medical Decision Making/ A&P Clinical Course as of 11/19/22 1110  Mon Nov 19, 2022  1052 Discussed with patient discharge treatment plan, answered all available questions. Pt appears safe for discharge.  [SB]    Clinical Course User Index [SB] Gisele Pack A, PA-C                             Medical Decision Making Risk Prescription drug management.   Pt presents with concerns for hand peeling status post manicure on 11/10/22. Pt afebrile. On exam, patient with peeling of skin noted diffusely to hands on palmar and dorsal aspect. No sloughing noted. No erythema, TTP, or drainage noted. Differential diagnosis  includes cellulitis, SJS, TENS, irritant dermatitis.    Social Determinants of Health: Pt doesn't have a PCP   Disposition: Presentation suspicious for rash in the setting of recent irritant. Doubt concerns at this time for SJS or TENS, no sloughing or TTP of skin noted on exam. Doubt cellulitis at this time. After consideration of the diagnostic results and the patients response to treatment, I feel that the patient would benefit from Discharge home. Pt discharged home with Rx for kenalog cream, also given information for Dermatolgoist for follow up regarding todays ED visit. TOC consult placed for PCP needs. Supportive care measures and strict return precautions discussed with patient at bedside. Pt acknowledges and verbalizes understanding. Pt appears safe for discharge. Follow up as indicated in discharge paperwork.    This chart was dictated using voice recognition software, Dragon. Despite the best efforts of this provider to proofread and correct errors, errors may still occur which can change documentation meaning.   Final Clinical Impression(s) / ED Diagnoses Final diagnoses:  Rash    Rx / DC Orders ED Discharge Orders          Ordered    triamcinolone cream (KENALOG) 0.1 %  2 times daily        11/19/22 1052              Khoury Siemon A, PA-C 11/19/22 1111    Alvira Monday, MD 11/20/22 2126

## 2022-11-19 NOTE — ED Triage Notes (Signed)
Patient presents to ED via POV from home. Reports getting a manicure on 6/1 and since, her hands have been peeling. Denies pain or itching.

## 2022-12-28 ENCOUNTER — Other Ambulatory Visit: Payer: Self-pay

## 2022-12-28 ENCOUNTER — Emergency Department (HOSPITAL_BASED_OUTPATIENT_CLINIC_OR_DEPARTMENT_OTHER)
Admission: EM | Admit: 2022-12-28 | Discharge: 2022-12-28 | Disposition: A | Discharge status: Home / Self Care | Payer: Medicaid Other | Attending: Emergency Medicine | Admitting: Emergency Medicine

## 2022-12-28 ENCOUNTER — Encounter (HOSPITAL_BASED_OUTPATIENT_CLINIC_OR_DEPARTMENT_OTHER): Payer: Self-pay

## 2022-12-28 DIAGNOSIS — J020 Streptococcal pharyngitis: Secondary | ICD-10-CM | POA: Insufficient documentation

## 2022-12-28 DIAGNOSIS — J029 Acute pharyngitis, unspecified: Secondary | ICD-10-CM | POA: Diagnosis present

## 2022-12-28 LAB — GROUP A STREP BY PCR: Group A Strep by PCR: DETECTED — AB

## 2022-12-28 MED ORDER — DEXAMETHASONE SODIUM PHOSPHATE 10 MG/ML IJ SOLN: 10.0000 mg | Freq: Once | INTRAMUSCULAR | Status: DC

## 2022-12-28 MED ORDER — PENICILLIN G BENZATHINE 1200000 UNIT/2ML IM SUSY
1.2000 10*6.[IU] | PREFILLED_SYRINGE | Freq: Once | INTRAMUSCULAR | Status: AC
  Administered 2022-12-28: 1.2 10*6.[IU] via INTRAMUSCULAR

## 2022-12-28 MED ORDER — DEXAMETHASONE 4 MG PO TABS
4.0000 mg | ORAL_TABLET | Freq: Once | ORAL | Status: AC
  Administered 2022-12-28: 4 mg via ORAL

## 2022-12-28 MED ORDER — LIDOCAINE HCL (PF) 1 % IJ SOLN: 2.0000 mL | Freq: Once | INTRAMUSCULAR | Status: DC

## 2022-12-28 NOTE — Discharge Instructions (Signed)
You have been seen today for your complaint of sore throat. Your lab work was positive for strep. Follow up with: Dr. Jenne Pane. He is an ENT doctor. Call to schedule an appointment Please seek immediate medical care if you develop any of the following symptoms: You have new symptoms, such as vomiting, severe headache, stiff or painful neck, chest pain, or shortness of breath. You have severe throat pain, drooling, or changes in your voice. You have swelling of the neck, or the skin on the neck becomes red and tender. You have signs of dehydration, such as tiredness (fatigue), dry mouth, and decreased urination. You become increasingly sleepy, or you cannot wake up completely. Your joints become red or painful. At this time there does not appear to be the presence of an emergent medical condition, however there is always the potential for conditions to change. Please read and follow the below instructions.  Do not take your medicine if  develop an itchy rash, swelling in your mouth or lips, or difficulty breathing; call 911 and seek immediate emergency medical attention if this occurs.  You may review your lab tests and imaging results in their entirety on your MyChart account.  Please discuss all results of fully with your primary care provider and other specialist at your follow-up visit.  Note: Portions of this text may have been transcribed using voice recognition software. Every effort was made to ensure accuracy; however, inadvertent computerized transcription errors may still be present.

## 2022-12-28 NOTE — ED Notes (Signed)
Pt held for 15 min post injection, no reaction noted

## 2022-12-28 NOTE — ED Triage Notes (Signed)
Patient having sore throat for two days. Hx of strep and stated it felt like this.

## 2022-12-28 NOTE — ED Provider Notes (Signed)
[facility] Provider Note   CSN: [id number] Arrival date & time: 12/28/22  1010     History  Chief Complaint  Patient presents with   Sore Throat    Christy Moran is a 22 y.o. female. With a history of anemia who presents to the ED for evaluation of sore throat. This began yesterday morning and has progressively gotten worse. Worse with eating and talking. Described as swallowing knives. States she has had strep throat 7 or 8 times in her life and this feels similar. Still has her tonsils. States her young son has had a fever for the past 2 days. Patient denies any fever, difficulty swallowing, neck stiffness, headaches.    Sore Throat       Home Medications Prior to Admission medications   Medication Sig Start Date End Date Taking? Authorizing Provider  iron polysaccharides (NIFEREX) 150 MG capsule Take 1 capsule (150 mg total) by mouth daily. 09/04/21   Dale [place], FNP  omeprazole (PRILOSEC) 20 MG capsule Take 1 capsule (20 mg total) by mouth daily. 03/09/22   Palumbo, April, MD  Prenatal Vit-Fe Fumarate-FA (PRENATAL MULTIVITAMIN) TABS tablet Take 1 tablet by mouth daily at 12 noon.    [provider]  triamcinolone cream (KENALOG) 0.1 % Apply 1 Application topically 2 (two) times daily. 11/19/22   Blue, Soijett A, PA-C      Allergies    Patient has no known allergies.    Review of Systems   Review of Systems  HENT:  Positive for sore throat.   All other systems reviewed and are negative.   Physical Exam Updated Vital Signs BP 110/66 (BP Location: Left Arm)   Pulse 96   Temp 99.1 F (37.3 C) (Oral)   Resp 17   Ht 5\' 4"  (1.626 m)   Wt 72 kg   LMP 12/28/2022 (Exact Date)   SpO2 100%   BMI 27.25 kg/m  Physical Exam Vitals and nursing note reviewed.  Constitutional:      General: She is not in acute distress.    Appearance: Normal appearance. She is normal weight. She is not ill-appearing.  HENT:      Head: Normocephalic and atraumatic.     Mouth/Throat:     Mouth: Mucous membranes are moist.     Comments: Posterior pharyngeal erythema. No exudates. Uvula midline. Tonsils 2+ bilaterally. No drooling, trismus or tripoding. No cervical lymphadenopathy Pulmonary:     Effort: Pulmonary effort is normal. No respiratory distress.  Abdominal:     General: Abdomen is flat.  Musculoskeletal:        General: Normal range of motion.     Cervical back: Neck supple.  Skin:    General: Skin is warm and dry.  Neurological:     Mental Status: She is alert and oriented to person, place, and time.  Psychiatric:        Mood and Affect: Mood normal.        Behavior: Behavior normal.     ED Results / Procedures / Treatments   Labs (all labs ordered are listed, but only abnormal results are displayed) Labs Reviewed  GROUP A STREP BY PCR - Abnormal; Notable for the following components:      Result Value   Group A Strep by PCR DETECTED (*)    All other components within normal limits    EKG None  Radiology No results found.  Procedures Procedures    Medications Ordered  in ED Medications  penicillin g benzathine (BICILLIN LA) 1200000 UNIT/2ML injection 1.2 Million Units (has no administration in time range)  lidocaine (PF) (XYLOCAINE) 1 % injection 2 mL (has no administration in time range)  dexamethasone (DECADRON) tablet 4 mg (has no administration in time range)    ED Course/ Medical Decision Making/ A&P                             Medical Decision Making This patient presents to the ED for concern of sore throat, this involves an extensive number of treatment options, and is a complaint that carries with it a high risk of complications and morbidity.  The differential diagnosis includes strep pharyngitis, viral pharyngitis, RPA, PTA  My initial workup includes  Additional history obtained from: Nursing notes from this visit.  I ordered, reviewed and interpreted labs which  include: rapid strep positive  Afebrile, hemodynamically stable. 22 year old presenting for evaluation of a sore throat. Rapid strep positive. 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 She appears well on exam other than some posterior pharyngeal erythema. I offered oral amoxicillin vs IM bicillin. She prefers bicillin today. She was also given a dose of decadron. She was given contact information for ENT due to recurring strep infections. She was given return precautions. Stable at discharge.   At this time there does not appear to be any evidence of an acute emergency medical condition and the patient appears stable for discharge with appropriate outpatient follow up. Diagnosis was discussed with patient who verbalizes understanding of care plan and is agreeable to discharge. I have discussed return precautions with patient who verbalizes understanding. Patient encouraged to follow-up with their PCP within 1 week. All questions answered.  Note: Portions of this report may have been transcribed using voice recognition software. Every effort was made to ensure accuracy; however, inadvertent computerized transcription errors may still be present.        Final Clinical Impression(s) / ED Diagnoses Final diagnoses:  Strep pharyngitis    Rx / DC Orders ED Discharge Orders     None         Michelle Piper, PA-C 12/28/22 1247    Loetta Rough, MD 12/28/22 1423

## 2023-02-21 ENCOUNTER — Encounter (HOSPITAL_BASED_OUTPATIENT_CLINIC_OR_DEPARTMENT_OTHER): Payer: Self-pay | Admitting: Pediatrics

## 2023-02-21 ENCOUNTER — Emergency Department (HOSPITAL_BASED_OUTPATIENT_CLINIC_OR_DEPARTMENT_OTHER)
Admission: EM | Admit: 2023-02-21 | Discharge: 2023-02-21 | Disposition: A | Discharge status: Home / Self Care | Payer: Medicaid Other | Attending: Emergency Medicine | Admitting: Emergency Medicine

## 2023-02-21 DIAGNOSIS — B3731 Acute candidiasis of vulva and vagina: Secondary | ICD-10-CM | POA: Insufficient documentation

## 2023-02-21 DIAGNOSIS — N898 Other specified noninflammatory disorders of vagina: Secondary | ICD-10-CM | POA: Diagnosis present

## 2023-02-21 DIAGNOSIS — N760 Acute vaginitis: Secondary | ICD-10-CM | POA: Insufficient documentation

## 2023-02-21 DIAGNOSIS — B9689 Other specified bacterial agents as the cause of diseases classified elsewhere: Secondary | ICD-10-CM

## 2023-02-21 LAB — URINALYSIS, ROUTINE W REFLEX MICROSCOPIC
Bilirubin Urine: NEGATIVE
Glucose, UA: NEGATIVE mg/dL
Hgb urine dipstick: NEGATIVE
Ketones, ur: NEGATIVE mg/dL
Nitrite: NEGATIVE
Protein, ur: NEGATIVE mg/dL
Specific Gravity, Urine: 1.02 (ref 1.005–1.030)
pH: 6 (ref 5.0–8.0)

## 2023-02-21 LAB — URINALYSIS, MICROSCOPIC (REFLEX)

## 2023-02-21 LAB — WET PREP, GENITAL
Sperm: NONE SEEN
Trich, Wet Prep: NONE SEEN
WBC, Wet Prep HPF POC: >=10 — AB (ref <10)

## 2023-02-21 LAB — PREGNANCY, URINE: Preg Test, Ur: NEGATIVE

## 2023-02-21 MED ORDER — METRONIDAZOLE 500 MG PO TABS: 500.0000 mg | ORAL_TABLET | Freq: Two times a day (BID) | ORAL | 0 refills | Status: AC

## 2023-02-21 MED ORDER — FLUCONAZOLE 150 MG PO TABS
150.0000 mg | ORAL_TABLET | Freq: Once | ORAL | Status: AC
  Administered 2023-02-21: 150 mg via ORAL
  Filled 2023-02-21: qty 1

## 2023-02-21 NOTE — ED Provider Notes (Signed)
[facility] Provider [place]   CSN: [id number] Arrival date & time: 02/21/23  2841     History No chief complaint on file.   Christy Moran is a 22 y.o. female patient who presents to the emergency department with vaginal irritation and pain has been ongoing for the last couple of weeks.  Patient states that initially started after having intercourse with her partner she was having some pruritus and irritation in the vaginal canal.  That is since resolved, now she is having swelling on the outside of the labia on the left side with associated dyspareunia.  Last intercourse was yesterday with the same partner that is a known established partner for her.  She has a low suspicion for STDs.  She does endorse some white vaginal discharge which is not abnormal for her.  She denies any urinary symptoms.  HPI     Home Medications Prior to Admission medications   Medication Sig Start Date End Date Taking? Authorizing Provider  metroNIDAZOLE (FLAGYL) 500 MG tablet Take 1 tablet (500 mg total) by mouth 2 (two) times daily. 02/21/23  Yes Meredeth Ide, Oliverio Cho M, PA-C  iron polysaccharides (NIFEREX) 150 MG capsule Take 1 capsule (150 mg total) by mouth daily. 09/04/21   Dale [place], FNP  omeprazole (PRILOSEC) 20 MG capsule Take 1 capsule (20 mg total) by mouth daily. 03/09/22   Palumbo, April, MD  Prenatal Vit-Fe Fumarate-FA (PRENATAL MULTIVITAMIN) TABS tablet Take 1 tablet by mouth daily at 12 noon.    [provider]  triamcinolone cream (KENALOG) 0.1 % Apply 1 Application topically 2 (two) times daily. 11/19/22   Blue, Soijett A, PA-C      Allergies    Patient has no known allergies.    Review of Systems   Review of Systems  All other systems reviewed and are negative.   Physical Exam Updated Vital Signs BP 120/87 (BP Location: Left Arm)   Pulse 90   Temp 98 F (36.7 C) (Oral)   Resp 17   Ht 5\' 4"  (1.626 m)   Wt 74.8 kg   LMP 02/02/2023    SpO2 100%   BMI 28.32 kg/m  Physical Exam Vitals and nursing note reviewed.  Constitutional:      General: She is not in acute distress.    Appearance: Normal appearance.  HENT:     Head: Normocephalic and atraumatic.  Eyes:     General:        Right eye: No discharge.        Left eye: No discharge.  Cardiovascular:     Comments: Regular rate and rhythm.  S1/S2 are distinct without any evidence of murmur, rubs, or gallops.  Radial pulses are 2+ bilaterally.  Dorsalis pedis pulses are 2+ bilaterally.  No evidence of pedal edema. Pulmonary:     Comments: Clear to auscultation bilaterally.  Normal effort.  No respiratory distress.  No evidence of wheezes, rales, or rhonchi heard throughout. Abdominal:     General: Abdomen is flat. Bowel sounds are normal. There is no distension.     Tenderness: There is no abdominal tenderness. There is no guarding or rebound.  Genitourinary:    Comments: There is mild swelling and tenderness to the labia minora on the left side approximately the 5 o'clock position.  There is no fullness or parulis indicate Bartholin's abscess at this time.  Pelvic exam did reveal significant amount of curdy white discharge within the vaginal canal.  Cervical  os was difficult to visualize.  There was no bleeding in the vaginal canal or swelling. Musculoskeletal:        General: Normal range of motion.     Cervical back: Neck supple.  Skin:    General: Skin is warm and dry.     Findings: No rash.  Neurological:     General: No focal deficit present.     Mental Status: She is alert.  Psychiatric:        Mood and Affect: Mood normal.        Behavior: Behavior normal.     ED Results / Procedures / Treatments   Labs (all labs ordered are listed, but only abnormal results are displayed) Labs Reviewed  WET PREP, GENITAL - Abnormal; Notable for the following components:      Result Value   Yeast Wet Prep HPF POC PRESENT (*)    Clue Cells Wet Prep HPF POC PRESENT  (*)    WBC, Wet Prep HPF POC >=10 (*)    All other components within normal limits  URINALYSIS, ROUTINE W REFLEX MICROSCOPIC - Abnormal; Notable for the following components:   Leukocytes,Ua SMALL (*)    All other components within normal limits  URINALYSIS, MICROSCOPIC (REFLEX) - Abnormal; Notable for the following components:   Bacteria, UA RARE (*)    All other components within normal limits  PREGNANCY, URINE  GC/CHLAMYDIA PROBE AMP ([place]) NOT AT [facility]    EKG None  Radiology No results found.  Procedures Procedures    Medications Ordered in ED Medications  fluconazole (DIFLUCAN) tablet 150 mg (has no administration in time range)    ED Course/ Medical Decision Making/ A&P Clinical Course as of 02/21/23 1246  Thu Feb 21, 2023  1239 Urinalysis, Routine w reflex microscopic -Urine, Clean Catch(!) There is some evidence of leukocytes in the urine in addition to white blood cells but there is some evidence of contamination considering the large amount of epithelial cells.  No overt UTI. [CF]  1240 Wet prep, genital(!) There is certainly yeast and clue cells present which is consistent with physical exam performed during pelvic exam. [CF]  1240 Pregnancy, urine Negative. [CF]    Clinical Course User Index [CF] Teressa Lower, PA-C   {   Click here for ABCD2, HEART and other calculators  Medical Decision Making Christy Moran is a 22 y.o. female patient who presents to the emergency department today for further evaluation of vaginal irritation and pain.  Given the findings on the physical exam I am concerned for possible bacterial vaginosis versus yeast infection.  The swelling over the left sided labia minora is at the appropriate location for Bartholin cyst although there is no obvious formation or purulence there.  This could be early or just irritation.  Labs sent off and will let patient know once he is returned.  Patient is in no acute distress at this time  with normal vital signs.  There is evidence of Candida vulvovaginitis which I will treat with fluconazole here 1 time.  I will also give her a prescription for bacterial vaginosis.  I advised her on this medication educated the patient findings.  All questions and concerns addressed.  She is safe for discharge.  Amount and/or Complexity of Data Reviewed Labs: ordered. Decision-making details documented in ED Course.  Risk Prescription drug management.    Final Clinical Impression(s) / ED Diagnoses Final diagnoses:  Bacterial vaginosis  Candidal vulvovaginitis    Rx /  DC Orders ED Discharge Orders          Ordered    metroNIDAZOLE (FLAGYL) 500 MG tablet  2 times daily        02/21/23 1242              Honor Loh Willard, [place] 02/21/23 1246    Arby Barrette, MD 02/22/23 [phone]

## 2023-02-21 NOTE — Discharge Instructions (Addendum)
There was evidence of yeast and clue cells on your labs like we discussed which is indicative of bacterial vaginosis and a yeast infection.  I am going to give you a prescription for metronidazole which will take care of the bacterial vaginosis.  The medication that you received today in the ER will take care of the yeast.  Please do not drink while taking metronidazole.  Please take it to its entirety.  Follow-up with your primary care doctor if you have 1.  You may return to the emergency room if any worsening symptoms.

## 2023-02-21 NOTE — ED Triage Notes (Signed)
C/O vaginal discomfort a couple of weeks ago after an intercourse with an established partner, states area is tender and some swelling.

## 2023-02-22 LAB — GC/CHLAMYDIA PROBE AMP (~~LOC~~) NOT AT ARMC
Chlamydia: NEGATIVE
Comment: NEGATIVE
Comment: NORMAL
Neisseria Gonorrhea: NEGATIVE

## 2023-08-09 ENCOUNTER — Ambulatory Visit: Payer: Medicaid Other | Admitting: Family Medicine

## 2023-12-08 ENCOUNTER — Encounter (HOSPITAL_BASED_OUTPATIENT_CLINIC_OR_DEPARTMENT_OTHER): Payer: Self-pay | Admitting: Radiology

## 2023-12-08 ENCOUNTER — Emergency Department (HOSPITAL_BASED_OUTPATIENT_CLINIC_OR_DEPARTMENT_OTHER)
Admission: EM | Admit: 2023-12-08 | Discharge: 2023-12-08 | Disposition: A | Discharge status: Home / Self Care | Attending: Emergency Medicine | Admitting: Emergency Medicine

## 2023-12-08 ENCOUNTER — Other Ambulatory Visit: Payer: Self-pay

## 2023-12-08 DIAGNOSIS — T24112A Burn of first degree of left thigh, initial encounter: Secondary | ICD-10-CM | POA: Insufficient documentation

## 2023-12-08 DIAGNOSIS — X100XXA Contact with hot drinks, initial encounter: Secondary | ICD-10-CM | POA: Insufficient documentation

## 2023-12-08 DIAGNOSIS — T24111A Burn of first degree of right thigh, initial encounter: Secondary | ICD-10-CM | POA: Insufficient documentation

## 2023-12-08 DIAGNOSIS — T300 Burn of unspecified body region, unspecified degree: Secondary | ICD-10-CM

## 2023-12-08 MED ORDER — LANCET DEVICE MISC: 1.0000 | Freq: Three times a day (TID) | 0 refills | Status: DC

## 2023-12-08 MED ORDER — BLOOD GLUCOSE TEST VI STRP: 1.0000 | ORAL_STRIP | Freq: Three times a day (TID) | 0 refills | Status: DC

## 2023-12-08 MED ORDER — BLOOD GLUCOSE MONITORING SUPPL DEVI: 1.0000 | Freq: Three times a day (TID) | 0 refills | Status: DC

## 2023-12-08 MED ORDER — LANCETS MISC. MISC: 1.0000 | Freq: Three times a day (TID) | 0 refills | Status: DC

## 2023-12-08 MED ORDER — HYDROCODONE-ACETAMINOPHEN 5-325 MG PO TABS
1.0000 | ORAL_TABLET | ORAL | 0 refills | Status: AC | PRN
Start: 2023-12-08 — End: ?

## 2023-12-08 MED ORDER — HYDROCODONE-ACETAMINOPHEN 5-325 MG PO TABS
1.0000 | ORAL_TABLET | Freq: Once | ORAL | Status: AC
  Administered 2023-12-08: 1 via ORAL
  Filled 2023-12-08: qty 1

## 2023-12-08 MED ORDER — IBUPROFEN 800 MG PO TABS: 800.0000 mg | ORAL_TABLET | Freq: Four times a day (QID) | ORAL | 0 refills | Status: AC | PRN

## 2023-12-08 MED ORDER — IBUPROFEN 800 MG PO TABS
800.0000 mg | ORAL_TABLET | Freq: Once | ORAL | Status: AC
  Administered 2023-12-08: 800 mg via ORAL
  Filled 2023-12-08: qty 1
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
## 2024-06-18 ENCOUNTER — Other Ambulatory Visit: Payer: Self-pay

## 2024-06-18 ENCOUNTER — Emergency Department (HOSPITAL_BASED_OUTPATIENT_CLINIC_OR_DEPARTMENT_OTHER): Admission: EM | Admit: 2024-06-18 | Discharge: 2024-06-18 | Disposition: A | Discharge status: Home / Self Care

## 2024-06-18 DIAGNOSIS — J029 Acute pharyngitis, unspecified: Secondary | ICD-10-CM | POA: Diagnosis present

## 2024-06-18 DIAGNOSIS — J020 Streptococcal pharyngitis: Secondary | ICD-10-CM | POA: Insufficient documentation

## 2024-06-18 LAB — GROUP A STREP BY PCR: Group A Strep by PCR: DETECTED — AB

## 2024-06-18 MED ORDER — ACETAMINOPHEN 325 MG PO TABS
650.0000 mg | ORAL_TABLET | Freq: Once | ORAL | Status: AC
  Administered 2024-06-18: 650 mg via ORAL
  Filled 2024-06-18: qty 2

## 2024-06-18 MED ORDER — PENICILLIN V POTASSIUM 250 MG PO TABS
500.0000 mg | ORAL_TABLET | Freq: Once | ORAL | Status: AC
  Administered 2024-06-18: 500 mg via ORAL
  Filled 2024-06-18: qty 2

## 2024-06-18 MED ORDER — PENICILLIN V POTASSIUM 500 MG PO TABS: 500.0000 mg | ORAL_TABLET | Freq: Three times a day (TID) | ORAL | 0 refills | Status: AC

## 2024-06-18 MED ORDER — DEXAMETHASONE 4 MG PO TABS
6.0000 mg | ORAL_TABLET | Freq: Once | ORAL | Status: AC
  Administered 2024-06-18: 6 mg via ORAL
  Filled 2024-06-18: qty 2

## 2024-06-18 NOTE — ED Triage Notes (Signed)
 Pt from home via POV.  Pt reports sore throat starting x2 days ago.  Pt denies cough.  Pt reports Hx of strep throat x4 / year and wants to be tested.

## 2024-06-18 NOTE — ED Provider Notes (Signed)
 " [facility] Provider Note   CSN: [id number] Arrival date & time: 06/18/24  1950     Patient presents with: Sore Throat   Christy Moran is a 24 y.o. female.   24 year old female with past medical history of recurrent strep throat in the past presenting to the emergency department today with sore throat.  Patient states that this started earlier today.  She states that she has had a fever today as well as sore throat.  Denies any difficulty breathing or swallowing.  Does report that his painful to swallow.  She does report recurring issues with strep throat in the past.  She came to the ER today for further evaluation regarding this due to these ongoing symptoms.   Sore Throat       Prior to Admission medications  Medication Sig Start Date End Date Taking? Authorizing Provider  penicillin  v potassium (VEETID) 500 MG tablet Take 1 tablet (500 mg total) by mouth 3 (three) times daily for 7 days. 06/18/24 06/25/24 Yes Ula Prentice SAUNDERS, MD  HYDROcodone -acetaminophen  (NORCO/VICODIN) 5-325 MG tablet Take 1 tablet by mouth every 4 (four) hours as needed for moderate pain (pain score 4-6). 12/08/23   Haze Lonni PARAS, MD  ibuprofen  (ADVIL ) 800 MG tablet Take 1 tablet (800 mg total) by mouth every 6 (six) hours as needed for moderate pain (pain score 4-6). 12/08/23   Haze Lonni PARAS, MD  iron  polysaccharides (NIFEREX) 150 MG capsule Take 1 capsule (150 mg total) by mouth daily. 09/04/21   [place] , Jade, FNP  metroNIDAZOLE  (FLAGYL ) 500 MG tablet Take 1 tablet (500 mg total) by mouth 2 (two) times daily. 02/21/23   Theotis Peers M, PA-C  omeprazole  (PRILOSEC) 20 MG capsule Take 1 capsule (20 mg total) by mouth daily. 03/09/22   Palumbo, April, MD  Prenatal Vit-Fe Fumarate-FA (PRENATAL MULTIVITAMIN) TABS tablet Take 1 tablet by mouth daily at 12 noon.    [provider]  triamcinolone  cream (KENALOG ) 0.1 % Apply 1 Application topically 2  (two) times daily. 11/19/22   Blue, Soijett A, PA-C    Allergies: Patient has no known allergies.    Review of Systems  Constitutional:  Positive for fever.  HENT:  Positive for sore throat.   All other systems reviewed and are negative.   Updated Vital Signs BP 120/84 (BP Location: Right Arm)   Pulse 96   Temp (!) 100.7 F (38.2 C)   Resp 18   Ht 5' 4 (1.626 m)   Wt 77.1 kg   LMP 05/25/2024 (Approximate)   SpO2 100%   BMI 29.18 kg/m   Physical Exam Vitals and nursing note reviewed.   Gen: NAD Eyes: PERRL, EOMI HEENT: Posterior oropharynx is erythematous with no peritonsillar swelling, or exudates noted Neck: trachea midline, anterior cervical lymphadenopathy noted that is tender to palpation Resp: clear to auscultation bilaterally Card: RRR, no murmurs, rubs, or gallops Abd: nontender, nondistended Extremities: no calf tenderness, no edema Vascular: 2+ radial pulses bilaterally, 2+ DP pulses bilaterally Skin: no rashes Psyc: acting appropriately   (all labs ordered are listed, but only abnormal results are displayed) Labs Reviewed  GROUP A STREP BY PCR - Abnormal; Notable for the following components:      Result Value   Group A Strep by PCR DETECTED (*)    All other components within normal limits    EKG: None  Radiology: No results found.   Procedures   Medications Ordered in the  ED  dexamethasone  (DECADRON ) tablet 6 mg (has no administration in time range)  penicillin  v potassium (VEETID) tablet 500 mg (has no administration in time range)                                    Medical Decision Making 24 year old female with past medical history of recurrent strep throat in the past presenting to the emergency department today with symptoms consistent with likely strep throat.  Her Centor score is high.  Will likely treat her regardless of the test but this was already been drawn at triage.  She does not have any findings on exam consistent with deep  space soft tissue infection or peritonsillar abscess at this time.  The patient strep test is positive.  Will treat her with oral antibiotics and discharged with return precautions.        Final diagnoses:  Strep pharyngitis    ED Discharge Orders          Ordered    penicillin  v potassium (VEETID) 500 MG tablet  3 times daily        06/18/24 2059               Ula Prentice SAUNDERS, MD 06/18/24 2059  "

## 2024-06-18 NOTE — Discharge Instructions (Signed)
 Your strep test was positive.  Please take the penicillin  as prescribed and follow-up with your doctor.  Return to the ER for worsening symptoms.  You may want a follow-up with the ENT doctor at the number provided given your recurring issues with this.

## 2024-06-25 ENCOUNTER — Other Ambulatory Visit (HOSPITAL_BASED_OUTPATIENT_CLINIC_OR_DEPARTMENT_OTHER): Payer: Self-pay
